# Patient Record
Sex: Female | Born: 1945 | Race: White | Hispanic: No | Smoking: Never smoker
Health system: Southern US, Community
[De-identification: ages and names within clinical notes are randomized; demographics above are authoritative.]

## PROBLEM LIST (undated history)

## (undated) DIAGNOSIS — K219 Gastro-esophageal reflux disease without esophagitis: Secondary | ICD-10-CM

## (undated) HISTORY — PX: ABDOMINAL HYSTERECTOMY: SHX81

---

## 2015-02-03 DIAGNOSIS — H9113 Presbycusis, bilateral: Secondary | ICD-10-CM | POA: Diagnosis not present

## 2015-02-03 DIAGNOSIS — Z0001 Encounter for general adult medical examination with abnormal findings: Secondary | ICD-10-CM | POA: Diagnosis not present

## 2015-02-03 DIAGNOSIS — R350 Frequency of micturition: Secondary | ICD-10-CM | POA: Diagnosis not present

## 2015-02-03 DIAGNOSIS — I10 Essential (primary) hypertension: Secondary | ICD-10-CM | POA: Diagnosis not present

## 2015-02-04 ENCOUNTER — Ambulatory Visit: Payer: Self-pay | Admitting: Family Medicine

## 2015-02-04 DIAGNOSIS — Z1231 Encounter for screening mammogram for malignant neoplasm of breast: Secondary | ICD-10-CM | POA: Diagnosis not present

## 2015-06-23 DIAGNOSIS — H903 Sensorineural hearing loss, bilateral: Secondary | ICD-10-CM | POA: Diagnosis not present

## 2016-02-09 ENCOUNTER — Ambulatory Visit
Admission: EM | Admit: 2016-02-09 | Discharge: 2016-02-09 | Disposition: A | Discharge status: Home / Self Care | Payer: Medicare Other | Attending: Family Medicine | Admitting: Family Medicine

## 2016-02-09 ENCOUNTER — Encounter: Payer: Self-pay | Admitting: Emergency Medicine

## 2016-02-09 ENCOUNTER — Ambulatory Visit: Payer: Medicare Other

## 2016-02-09 DIAGNOSIS — M5442 Lumbago with sciatica, left side: Secondary | ICD-10-CM | POA: Diagnosis not present

## 2016-02-09 DIAGNOSIS — M545 Low back pain: Secondary | ICD-10-CM | POA: Diagnosis not present

## 2016-02-09 MED ORDER — CYCLOBENZAPRINE HCL 5 MG PO TABS: 5.0000 mg | ORAL_TABLET | Freq: Two times a day (BID) | ORAL | Status: DC | PRN

## 2016-02-09 MED ORDER — METHYLPREDNISOLONE 4 MG PO TBPK: ORAL_TABLET | ORAL | Status: DC

## 2016-02-09 NOTE — ED Provider Notes (Signed)
CSN: [id number]     Arrival date & time 02/09/16  0825 History   First MD Initiated Contact with Patient 02/09/16 [phone]     Chief Complaint  Patient presents with  . Back Pain  . Hip Pain   (Consider location/radiation/quality/duration/timing/severity/associated sxs/prior Treatment) HPI: Patient presents today with symptoms of left-sided low back pain that radiates into the left leg. Patient denies any trauma or injury. Patient states that she's had the symptoms for the last 6 months. Over the last week she has noticed an increase in her symptoms. She denies any incontinence, foot drop, fever, weight loss. She denies ever having an MRI of her lower back. She attributes her blood pressure being elevated due to her pain. She denies any chest pain, shortness of breath, nausea, vomiting, severe headache.  History reviewed. No pertinent past medical history. Past Surgical History  Procedure Laterality Date  . Abdominal hysterectomy     History reviewed. No pertinent family history. Social History  Substance Use Topics  . Smoking status: Never Smoker   . Smokeless tobacco: None  . Alcohol Use: No   OB History    No data available     Review of Systems: Negative except mentioned above.   Allergies  Contrast media  Home Medications   Prior to Admission medications   Not on File   Meds Ordered and Administered this Visit  Medications - No data to display  BP 187/88 mmHg  Pulse 106  Temp(Src) 98.6 F (37 C) (Tympanic)  Resp 16  Ht 5\' 3"  (1.6 m)  Wt 179 lb (81.194 kg)  BMI 31.72 kg/m2  SpO2 100% No data found.   Physical Exam   GENERAL: NAD HEENT: no pharyngeal erythema, no exudate RESP: CTA B CARD: RRR MSK: no midline tenderness, mild left SI tenderness and mild lower lumbar paravertebral tenderness, ROM limited with flexion>extension, 4+/5 strength of LLE, +SLR, normal pedal pulses, -Homans, no foot drop appreciated  NEURO: CN II-XII grossly intact   ED Course   Procedures (including critical care time)  Labs Review Labs Reviewed - No data to display  Imaging Review No results found.   MDM   A/P: Left lower back pain with left radicular symptoms- lumbar spine x-rays do show degenerative changes, would recommend that patient follow up with her primary care physician this week for further evaluation and treatment, would recommend that she get an MRI to further evaluate the etiology of her symptoms. I did recommend to the patient that if she has any worsening symptoms or has incontinence, foot drop to go to the ER immediately. I will prescribe her Medrol dosepak and Flexeril. She can use Tylenol if needed as well. I've also asked that she follow up with her doctor regarding her blood pressure I do however feel that her blood pressure is elevated today due to her level of discomfort.   Jolene ProvostKirtida Mahkayla Preece, MD 02/09/16 1000

## 2016-02-09 NOTE — ED Notes (Signed)
Patient c/o lower back pain and left hip pain that goes into her left leg off and on for the past 6 month.

## 2017-02-06 DIAGNOSIS — H25813 Combined forms of age-related cataract, bilateral: Secondary | ICD-10-CM | POA: Diagnosis not present

## 2017-08-15 DIAGNOSIS — R945 Abnormal results of liver function studies: Secondary | ICD-10-CM | POA: Diagnosis not present

## 2017-08-15 DIAGNOSIS — R000 Tachycardia, unspecified: Secondary | ICD-10-CM | POA: Diagnosis not present

## 2017-08-15 DIAGNOSIS — R6884 Jaw pain: Secondary | ICD-10-CM | POA: Diagnosis not present

## 2017-08-15 DIAGNOSIS — Z8619 Personal history of other infectious and parasitic diseases: Secondary | ICD-10-CM | POA: Diagnosis not present

## 2017-08-15 DIAGNOSIS — R748 Abnormal levels of other serum enzymes: Secondary | ICD-10-CM | POA: Diagnosis not present

## 2017-08-15 DIAGNOSIS — R109 Unspecified abdominal pain: Secondary | ICD-10-CM | POA: Diagnosis not present

## 2017-08-15 DIAGNOSIS — K8000 Calculus of gallbladder with acute cholecystitis without obstruction: Secondary | ICD-10-CM | POA: Diagnosis not present

## 2017-08-15 DIAGNOSIS — R1011 Right upper quadrant pain: Secondary | ICD-10-CM | POA: Diagnosis not present

## 2017-08-15 DIAGNOSIS — I10 Essential (primary) hypertension: Secondary | ICD-10-CM | POA: Diagnosis not present

## 2017-08-15 DIAGNOSIS — K838 Other specified diseases of biliary tract: Secondary | ICD-10-CM | POA: Diagnosis not present

## 2017-08-15 DIAGNOSIS — K8021 Calculus of gallbladder without cholecystitis with obstruction: Secondary | ICD-10-CM | POA: Diagnosis not present

## 2017-08-15 DIAGNOSIS — R1031 Right lower quadrant pain: Secondary | ICD-10-CM | POA: Diagnosis not present

## 2017-08-15 DIAGNOSIS — K5790 Diverticulosis of intestine, part unspecified, without perforation or abscess without bleeding: Secondary | ICD-10-CM | POA: Diagnosis present

## 2017-08-15 DIAGNOSIS — R932 Abnormal findings on diagnostic imaging of liver and biliary tract: Secondary | ICD-10-CM | POA: Diagnosis not present

## 2017-08-15 DIAGNOSIS — K8020 Calculus of gallbladder without cholecystitis without obstruction: Secondary | ICD-10-CM | POA: Diagnosis not present

## 2017-08-15 DIAGNOSIS — R112 Nausea with vomiting, unspecified: Secondary | ICD-10-CM | POA: Diagnosis not present

## 2017-08-15 DIAGNOSIS — N390 Urinary tract infection, site not specified: Secondary | ICD-10-CM | POA: Diagnosis not present

## 2017-08-15 DIAGNOSIS — K8080 Other cholelithiasis without obstruction: Secondary | ICD-10-CM | POA: Diagnosis not present

## 2017-08-15 DIAGNOSIS — K8050 Calculus of bile duct without cholangitis or cholecystitis without obstruction: Secondary | ICD-10-CM | POA: Diagnosis not present

## 2017-08-15 DIAGNOSIS — K859 Acute pancreatitis without necrosis or infection, unspecified: Secondary | ICD-10-CM | POA: Diagnosis not present

## 2017-08-23 DIAGNOSIS — Z6832 Body mass index (BMI) 32.0-32.9, adult: Secondary | ICD-10-CM | POA: Diagnosis not present

## 2017-08-23 DIAGNOSIS — I10 Essential (primary) hypertension: Secondary | ICD-10-CM | POA: Diagnosis not present

## 2017-08-23 DIAGNOSIS — R945 Abnormal results of liver function studies: Secondary | ICD-10-CM | POA: Diagnosis not present

## 2017-08-23 DIAGNOSIS — Z131 Encounter for screening for diabetes mellitus: Secondary | ICD-10-CM | POA: Diagnosis not present

## 2017-08-23 DIAGNOSIS — K8020 Calculus of gallbladder without cholecystitis without obstruction: Secondary | ICD-10-CM | POA: Diagnosis not present

## 2017-08-23 DIAGNOSIS — Z79899 Other long term (current) drug therapy: Secondary | ICD-10-CM | POA: Diagnosis not present

## 2017-09-10 DIAGNOSIS — K8020 Calculus of gallbladder without cholecystitis without obstruction: Secondary | ICD-10-CM | POA: Diagnosis not present

## 2017-09-14 DIAGNOSIS — K829 Disease of gallbladder, unspecified: Secondary | ICD-10-CM | POA: Diagnosis not present

## 2017-09-14 DIAGNOSIS — R112 Nausea with vomiting, unspecified: Secondary | ICD-10-CM | POA: Diagnosis not present

## 2017-09-14 DIAGNOSIS — K8010 Calculus of gallbladder with chronic cholecystitis without obstruction: Secondary | ICD-10-CM | POA: Diagnosis not present

## 2017-09-14 DIAGNOSIS — I10 Essential (primary) hypertension: Secondary | ICD-10-CM | POA: Diagnosis not present

## 2017-09-14 DIAGNOSIS — Z9889 Other specified postprocedural states: Secondary | ICD-10-CM | POA: Diagnosis not present

## 2017-09-14 DIAGNOSIS — Z880 Allergy status to penicillin: Secondary | ICD-10-CM | POA: Diagnosis not present

## 2017-09-14 DIAGNOSIS — R7989 Other specified abnormal findings of blood chemistry: Secondary | ICD-10-CM | POA: Diagnosis not present

## 2017-09-14 DIAGNOSIS — Z87891 Personal history of nicotine dependence: Secondary | ICD-10-CM | POA: Diagnosis not present

## 2017-09-14 DIAGNOSIS — E669 Obesity, unspecified: Secondary | ICD-10-CM | POA: Diagnosis not present

## 2017-09-14 DIAGNOSIS — Z6832 Body mass index (BMI) 32.0-32.9, adult: Secondary | ICD-10-CM | POA: Diagnosis not present

## 2017-09-14 DIAGNOSIS — Z91012 Allergy to eggs: Secondary | ICD-10-CM | POA: Diagnosis not present

## 2017-09-14 DIAGNOSIS — K8020 Calculus of gallbladder without cholecystitis without obstruction: Secondary | ICD-10-CM | POA: Diagnosis not present

## 2017-09-15 DIAGNOSIS — E669 Obesity, unspecified: Secondary | ICD-10-CM | POA: Diagnosis not present

## 2017-09-15 DIAGNOSIS — Z880 Allergy status to penicillin: Secondary | ICD-10-CM | POA: Diagnosis not present

## 2017-09-15 DIAGNOSIS — R7989 Other specified abnormal findings of blood chemistry: Secondary | ICD-10-CM | POA: Diagnosis not present

## 2017-09-15 DIAGNOSIS — Z87891 Personal history of nicotine dependence: Secondary | ICD-10-CM | POA: Diagnosis not present

## 2017-09-15 DIAGNOSIS — K8010 Calculus of gallbladder with chronic cholecystitis without obstruction: Secondary | ICD-10-CM | POA: Diagnosis not present

## 2017-09-15 DIAGNOSIS — I10 Essential (primary) hypertension: Secondary | ICD-10-CM | POA: Diagnosis not present

## 2017-09-16 DIAGNOSIS — I10 Essential (primary) hypertension: Secondary | ICD-10-CM | POA: Diagnosis not present

## 2017-09-16 DIAGNOSIS — Z880 Allergy status to penicillin: Secondary | ICD-10-CM | POA: Diagnosis not present

## 2017-09-16 DIAGNOSIS — K8010 Calculus of gallbladder with chronic cholecystitis without obstruction: Secondary | ICD-10-CM | POA: Diagnosis not present

## 2017-09-16 DIAGNOSIS — Z87891 Personal history of nicotine dependence: Secondary | ICD-10-CM | POA: Diagnosis not present

## 2017-09-16 DIAGNOSIS — E669 Obesity, unspecified: Secondary | ICD-10-CM | POA: Diagnosis not present

## 2017-09-16 DIAGNOSIS — R7989 Other specified abnormal findings of blood chemistry: Secondary | ICD-10-CM | POA: Diagnosis not present

## 2017-10-08 DIAGNOSIS — R000 Tachycardia, unspecified: Secondary | ICD-10-CM | POA: Diagnosis not present

## 2017-10-08 DIAGNOSIS — Z8719 Personal history of other diseases of the digestive system: Secondary | ICD-10-CM | POA: Diagnosis not present

## 2017-10-08 DIAGNOSIS — R109 Unspecified abdominal pain: Secondary | ICD-10-CM | POA: Diagnosis not present

## 2017-10-08 DIAGNOSIS — R630 Anorexia: Secondary | ICD-10-CM | POA: Diagnosis not present

## 2017-10-08 DIAGNOSIS — Z9049 Acquired absence of other specified parts of digestive tract: Secondary | ICD-10-CM | POA: Diagnosis not present

## 2017-12-12 DIAGNOSIS — M546 Pain in thoracic spine: Secondary | ICD-10-CM | POA: Diagnosis not present

## 2017-12-12 DIAGNOSIS — I10 Essential (primary) hypertension: Secondary | ICD-10-CM | POA: Diagnosis not present

## 2017-12-12 DIAGNOSIS — M1711 Unilateral primary osteoarthritis, right knee: Secondary | ICD-10-CM | POA: Diagnosis not present

## 2017-12-12 DIAGNOSIS — Z6830 Body mass index (BMI) 30.0-30.9, adult: Secondary | ICD-10-CM | POA: Diagnosis not present

## 2018-07-25 ENCOUNTER — Other Ambulatory Visit: Payer: Self-pay | Admitting: Family Medicine

## 2018-07-25 DIAGNOSIS — M545 Low back pain: Secondary | ICD-10-CM | POA: Diagnosis not present

## 2018-07-25 DIAGNOSIS — H9113 Presbycusis, bilateral: Secondary | ICD-10-CM | POA: Diagnosis not present

## 2018-07-25 DIAGNOSIS — Z0001 Encounter for general adult medical examination with abnormal findings: Secondary | ICD-10-CM | POA: Diagnosis not present

## 2018-07-25 DIAGNOSIS — Z1231 Encounter for screening mammogram for malignant neoplasm of breast: Secondary | ICD-10-CM

## 2018-07-25 DIAGNOSIS — I10 Essential (primary) hypertension: Secondary | ICD-10-CM | POA: Diagnosis not present

## 2018-07-25 DIAGNOSIS — Z6831 Body mass index (BMI) 31.0-31.9, adult: Secondary | ICD-10-CM | POA: Diagnosis not present

## 2018-07-25 DIAGNOSIS — R1310 Dysphagia, unspecified: Secondary | ICD-10-CM | POA: Diagnosis not present

## 2018-07-25 DIAGNOSIS — R945 Abnormal results of liver function studies: Secondary | ICD-10-CM | POA: Diagnosis not present

## 2018-07-25 DIAGNOSIS — Z131 Encounter for screening for diabetes mellitus: Secondary | ICD-10-CM | POA: Diagnosis not present

## 2018-07-25 DIAGNOSIS — Z79899 Other long term (current) drug therapy: Secondary | ICD-10-CM | POA: Diagnosis not present

## 2018-07-31 ENCOUNTER — Encounter (INDEPENDENT_AMBULATORY_CARE_PROVIDER_SITE_OTHER): Payer: Self-pay

## 2018-07-31 ENCOUNTER — Ambulatory Visit
Admission: RE | Admit: 2018-07-31 | Discharge: 2018-07-31 | Disposition: A | Discharge status: Home / Self Care | Payer: Medicare Other | Source: Ambulatory Visit | Attending: Family Medicine | Admitting: Family Medicine

## 2018-07-31 DIAGNOSIS — Z1231 Encounter for screening mammogram for malignant neoplasm of breast: Secondary | ICD-10-CM | POA: Insufficient documentation

## 2018-08-27 DIAGNOSIS — R1310 Dysphagia, unspecified: Secondary | ICD-10-CM | POA: Diagnosis not present

## 2018-08-27 DIAGNOSIS — K219 Gastro-esophageal reflux disease without esophagitis: Secondary | ICD-10-CM | POA: Diagnosis not present

## 2018-10-16 DIAGNOSIS — R1310 Dysphagia, unspecified: Secondary | ICD-10-CM | POA: Diagnosis not present

## 2018-10-16 DIAGNOSIS — K219 Gastro-esophageal reflux disease without esophagitis: Secondary | ICD-10-CM | POA: Diagnosis not present

## 2019-06-17 ENCOUNTER — Other Ambulatory Visit: Payer: Self-pay | Admitting: Family Medicine

## 2019-06-17 ENCOUNTER — Other Ambulatory Visit: Payer: Self-pay | Admitting: Nurse Practitioner

## 2019-06-17 DIAGNOSIS — Z1231 Encounter for screening mammogram for malignant neoplasm of breast: Secondary | ICD-10-CM

## 2020-07-05 ENCOUNTER — Other Ambulatory Visit: Payer: Self-pay

## 2020-07-05 ENCOUNTER — Observation Stay
Admission: EM | Admit: 2020-07-05 | Discharge: 2020-07-06 | Disposition: A | Discharge status: Home / Self Care | Payer: Medicare Other | Attending: Internal Medicine | Admitting: Internal Medicine

## 2020-07-05 ENCOUNTER — Emergency Department: Payer: Medicare Other

## 2020-07-05 DIAGNOSIS — Z20822 Contact with and (suspected) exposure to covid-19: Secondary | ICD-10-CM | POA: Diagnosis not present

## 2020-07-05 DIAGNOSIS — Z853 Personal history of malignant neoplasm of breast: Secondary | ICD-10-CM | POA: Insufficient documentation

## 2020-07-05 DIAGNOSIS — K219 Gastro-esophageal reflux disease without esophagitis: Secondary | ICD-10-CM | POA: Diagnosis present

## 2020-07-05 DIAGNOSIS — I10 Essential (primary) hypertension: Secondary | ICD-10-CM | POA: Diagnosis not present

## 2020-07-05 DIAGNOSIS — N3000 Acute cystitis without hematuria: Secondary | ICD-10-CM

## 2020-07-05 DIAGNOSIS — I160 Hypertensive urgency: Secondary | ICD-10-CM | POA: Diagnosis not present

## 2020-07-05 DIAGNOSIS — R350 Frequency of micturition: Secondary | ICD-10-CM | POA: Diagnosis not present

## 2020-07-05 DIAGNOSIS — R5383 Other fatigue: Secondary | ICD-10-CM | POA: Insufficient documentation

## 2020-07-05 DIAGNOSIS — N390 Urinary tract infection, site not specified: Secondary | ICD-10-CM | POA: Diagnosis present

## 2020-07-05 DIAGNOSIS — R42 Dizziness and giddiness: Secondary | ICD-10-CM

## 2020-07-05 HISTORY — DX: Gastro-esophageal reflux disease without esophagitis: K21.9

## 2020-07-05 LAB — COMPREHENSIVE METABOLIC PANEL
ALT: 22 U/L (ref 0–44)
AST: 19 U/L (ref 15–41)
Albumin: 4.3 g/dL (ref 3.5–5.0)
Alkaline Phosphatase: 83 U/L (ref 38–126)
Anion gap: 9 (ref 5–15)
BUN: 24 mg/dL — ABNORMAL HIGH (ref 8–23)
CO2: 26 mmol/L (ref 22–32)
Calcium: 8.8 mg/dL — ABNORMAL LOW (ref 8.9–10.3)
Chloride: 104 mmol/L (ref 98–111)
Creatinine, Ser: 0.64 mg/dL (ref 0.44–1.00)
GFR calc Af Amer: >60 mL/min (ref >60)
GFR calc non Af Amer: >60 mL/min (ref >60)
Glucose, Bld: 147 mg/dL — ABNORMAL HIGH (ref 70–99)
Potassium: 3.5 mmol/L (ref 3.5–5.1)
Sodium: 139 mmol/L (ref 135–145)
Total Bilirubin: 0.8 mg/dL (ref 0.3–1.2)
Total Protein: 7.7 g/dL (ref 6.5–8.1)

## 2020-07-05 LAB — CBC WITH DIFFERENTIAL/PLATELET
Abs Immature Granulocytes: 0.03 10*3/uL (ref 0.00–0.07)
Basophils Absolute: 0.1 10*3/uL (ref 0.0–0.1)
Basophils Relative: 1 %
Eosinophils Absolute: 0.1 10*3/uL (ref 0.0–0.5)
Eosinophils Relative: 1 %
HCT: 45.9 % (ref 36.0–46.0)
Hemoglobin: 16.2 g/dL — ABNORMAL HIGH (ref 12.0–15.0)
Immature Granulocytes: 0 %
Lymphocytes Relative: 24 %
Lymphs Abs: 2 10*3/uL (ref 0.7–4.0)
MCH: 31 pg (ref 26.0–34.0)
MCHC: 35.3 g/dL (ref 30.0–36.0)
MCV: 87.8 fL (ref 80.0–100.0)
Monocytes Absolute: 0.6 10*3/uL (ref 0.1–1.0)
Monocytes Relative: 7 %
Neutro Abs: 5.7 10*3/uL (ref 1.7–7.7)
Neutrophils Relative %: 67 %
Platelets: 239 10*3/uL (ref 150–400)
RBC: 5.23 MIL/uL — ABNORMAL HIGH (ref 3.87–5.11)
RDW: 12.8 % (ref 11.5–15.5)
WBC: 8.5 10*3/uL (ref 4.0–10.5)
nRBC: 0 % (ref 0.0–0.2)

## 2020-07-05 LAB — URINALYSIS, COMPLETE (UACMP) WITH MICROSCOPIC
Bilirubin Urine: NEGATIVE
Glucose, UA: NEGATIVE mg/dL
Ketones, ur: 5 mg/dL — AB
Nitrite: NEGATIVE
Protein, ur: NEGATIVE mg/dL
Specific Gravity, Urine: 1.019 (ref 1.005–1.030)
WBC, UA: >50 WBC/hpf — ABNORMAL HIGH (ref 0–5)
pH: 5 (ref 5.0–8.0)

## 2020-07-05 LAB — MAGNESIUM: Magnesium: 2.2 mg/dL (ref 1.7–2.4)

## 2020-07-05 LAB — SARS CORONAVIRUS 2 BY RT PCR (HOSPITAL ORDER, PERFORMED IN ~~LOC~~ HOSPITAL LAB): SARS Coronavirus 2: NEGATIVE

## 2020-07-05 LAB — TROPONIN I (HIGH SENSITIVITY)
Troponin I (High Sensitivity): 5 ng/L (ref <18)
Troponin I (High Sensitivity): 5 ng/L (ref <18)

## 2020-07-05 MED ORDER — SODIUM CHLORIDE 0.9 % IV SOLN: 1.0000 g | Freq: Once | INTRAVENOUS | Status: DC

## 2020-07-05 MED ORDER — ONDANSETRON HCL 4 MG/2ML IJ SOLN
4.0000 mg | Freq: Once | INTRAMUSCULAR | Status: AC
  Administered 2020-07-05: 4 mg via INTRAVENOUS
  Filled 2020-07-05: qty 2

## 2020-07-05 MED ORDER — CLONIDINE HCL 0.1 MG PO TABS
0.1000 mg | ORAL_TABLET | Freq: Once | ORAL | Status: AC
  Administered 2020-07-05: 0.1 mg via ORAL
  Filled 2020-07-05: qty 1

## 2020-07-05 MED ORDER — AMLODIPINE BESYLATE 5 MG PO TABS
5.0000 mg | ORAL_TABLET | Freq: Once | ORAL | Status: AC
  Administered 2020-07-05: 5 mg via ORAL
  Filled 2020-07-05: qty 1

## 2020-07-05 MED ORDER — HYDRALAZINE HCL 20 MG/ML IJ SOLN
10.0000 mg | Freq: Once | INTRAMUSCULAR | Status: AC
  Administered 2020-07-05: 10 mg via INTRAVENOUS
  Filled 2020-07-05: qty 1

## 2020-07-05 MED ORDER — SODIUM CHLORIDE 0.9 % IV SOLN
1.0000 g | Freq: Once | INTRAVENOUS | Status: AC
  Administered 2020-07-05: 1 g via INTRAVENOUS
  Filled 2020-07-05: qty 10

## 2020-07-05 MED ORDER — SODIUM CHLORIDE 0.9 % IV BOLUS
1000.0000 mL | Freq: Once | INTRAVENOUS | Status: AC
  Administered 2020-07-05: 1000 mL via INTRAVENOUS

## 2020-07-05 MED ORDER — PANTOPRAZOLE SODIUM 40 MG PO TBEC
40.0000 mg | DELAYED_RELEASE_TABLET | Freq: Every day | ORAL | Status: DC
  Administered 2020-07-05 – 2020-07-06 (×2): 40 mg via ORAL
  Filled 2020-07-05 (×2): qty 1

## 2020-07-05 MED ORDER — MECLIZINE HCL 25 MG PO TABS
25.0000 mg | ORAL_TABLET | Freq: Three times a day (TID) | ORAL | Status: DC | PRN
  Administered 2020-07-06: 25 mg via ORAL
  Filled 2020-07-05 (×2): qty 1

## 2020-07-05 MED ORDER — LABETALOL HCL 5 MG/ML IV SOLN
10.0000 mg | Freq: Once | INTRAVENOUS | Status: AC
  Administered 2020-07-05: 10 mg via INTRAVENOUS
  Filled 2020-07-05: qty 4

## 2020-07-05 MED ORDER — MIDAZOLAM HCL 2 MG/2ML IJ SOLN
1.0000 mg | Freq: Once | INTRAMUSCULAR | Status: AC
  Administered 2020-07-05: 1 mg via INTRAVENOUS
  Filled 2020-07-05: qty 2

## 2020-07-05 MED ORDER — TRAMADOL HCL 50 MG PO TABS
50.0000 mg | ORAL_TABLET | Freq: Once | ORAL | Status: AC
  Administered 2020-07-06: 50 mg via ORAL
  Filled 2020-07-05 (×2): qty 1

## 2020-07-05 MED ORDER — HYDRALAZINE HCL 20 MG/ML IJ SOLN
5.0000 mg | INTRAMUSCULAR | Status: DC | PRN
  Administered 2020-07-06: 12:00:00 5 mg via INTRAVENOUS
  Filled 2020-07-05 (×2): qty 1

## 2020-07-05 MED ORDER — ACETAMINOPHEN 500 MG PO TABS
1000.0000 mg | ORAL_TABLET | Freq: Once | ORAL | Status: AC
  Administered 2020-07-05: 1000 mg via ORAL
  Filled 2020-07-05: qty 2

## 2020-07-05 MED ORDER — AMLODIPINE BESYLATE 10 MG PO TABS
10.0000 mg | ORAL_TABLET | Freq: Every day | ORAL | Status: DC
  Administered 2020-07-06: 10 mg via ORAL
  Filled 2020-07-05: qty 1

## 2020-07-05 MED ORDER — SODIUM CHLORIDE 0.9 % IV SOLN
2.0000 g | Freq: Once | INTRAVENOUS | Status: AC
  Administered 2020-07-06: 2 g via INTRAVENOUS
  Filled 2020-07-05: qty 2

## 2020-07-05 MED ORDER — ACETAMINOPHEN 325 MG PO TABS
650.0000 mg | ORAL_TABLET | Freq: Four times a day (QID) | ORAL | Status: DC | PRN
  Administered 2020-07-05 – 2020-07-06 (×3): 650 mg via ORAL
  Filled 2020-07-05 (×4): qty 2

## 2020-07-05 MED ORDER — ENOXAPARIN SODIUM 40 MG/0.4ML ~~LOC~~ SOLN
40.0000 mg | SUBCUTANEOUS | Status: DC
  Filled 2020-07-05 (×2): qty 0.4

## 2020-07-05 MED ORDER — ONDANSETRON HCL 4 MG/2ML IJ SOLN: 4.0000 mg | Freq: Once | INTRAMUSCULAR | Status: AC

## 2020-07-05 MED ORDER — ONDANSETRON HCL 4 MG/2ML IJ SOLN
INTRAMUSCULAR | Status: AC
  Administered 2020-07-05: 4 mg via INTRAVENOUS
  Filled 2020-07-05: qty 2

## 2020-07-05 MED ORDER — HYDROXYZINE HCL 10 MG PO TABS
10.0000 mg | ORAL_TABLET | Freq: Three times a day (TID) | ORAL | Status: DC | PRN
  Administered 2020-07-05 (×2): 10 mg via ORAL
  Filled 2020-07-05 (×4): qty 1

## 2020-07-05 NOTE — ED Triage Notes (Signed)
Pt arrives [facility] from home w cc of dizziness. Pt went to bathroom at 330, got dizzy and nauseous. Called thinking she had a stroke. Stroke screen negative with EMS. Pt reports weird feeling to R side of head for past few weeks, usually goes away.  BP 215/95

## 2020-07-05 NOTE — ED Notes (Signed)
Requested PRN nausea medication from pharmacy.

## 2020-07-05 NOTE — ED Notes (Signed)
Pt ambulated with this RN in room to toilet. Pt denies nausea at this time, did wobble a bit while walking with assistance. Reports 4/10 headache. Lights dimmed for comfort.

## 2020-07-05 NOTE — ED Provider Notes (Signed)
I assumed care of this patient from Dr. Megan Salon at approximately 0 700.  Please see her note for full details regarding patient's presentation and exam as well as initial work-up and plan of care.  In brief patient presented hypertensive with complaints of acute onset of vertigo and nausea.  Patient is a nonfocal neuro exam.  Initial troponin and ECG are reassuring although she does not have a slightly prolonged QTc interval.  CT head is unremarkable.  UA is concerning for possible cystitis and patient was given Rocephin via phone provider.  Plan is to follow-up MRI brain as well as repeat troponin to assess for evidence of CVA and ACS.  MRI unremarkable.  Repeat Trope unremarkable.  On my reassessment patient is still symptomatic and hypertensive.  Additional antihypertensive medications given although on subsequent reassessments patient states he still felt lightheaded while standing supine in bed.  Plan to admit for hypertensive urgency and dizziness in the setting of UTI.  Medications  acetaminophen (TYLENOL) tablet 650 mg (has no administration in time range)  hydrALAZINE (APRESOLINE) injection 5 mg (has no administration in time range)  meclizine (ANTIVERT) tablet 25 mg (has no administration in time range)  hydrOXYzine (ATARAX/VISTARIL) tablet 10 mg (10 mg Oral Given 07/05/20 1046)  enoxaparin (LOVENOX) injection 40 mg (has no administration in time range)  pantoprazole (PROTONIX) EC tablet 40 mg (has no administration in time range)  cefTRIAXone (ROCEPHIN) 1 g in sodium chloride 0.9 % 100 mL IVPB (has no administration in time range)  sodium chloride 0.9 % bolus 1,000 mL (0 mLs Intravenous Stopped 07/05/20 0946)  midazolam (VERSED) injection 1 mg (1 mg Intravenous Given 07/05/20 0512)  ondansetron (ZOFRAN) injection 4 mg (4 mg Intravenous Given 07/05/20 0511)  midazolam (VERSED) injection 1 mg (1 mg Intravenous Given 07/05/20 0702)  ondansetron (ZOFRAN) injection 4 mg (4 mg Intravenous Given 07/05/20 0655)   cefTRIAXone (ROCEPHIN) 1 g in sodium chloride 0.9 % 100 mL IVPB (0 g Intravenous Stopped 07/05/20 0752)  acetaminophen (TYLENOL) tablet 1,000 mg (1,000 mg Oral Given 07/05/20 0817)  labetalol (NORMODYNE) injection 10 mg (10 mg Intravenous Given 07/05/20 0817)  amLODipine (NORVASC) tablet 5 mg (5 mg Oral Given 07/05/20 0818)  hydrALAZINE (APRESOLINE) injection 10 mg (10 mg Intravenous Given 07/05/20 0843)  cloNIDine (CATAPRES) tablet 0.1 mg (0.1 mg Oral Given 07/05/20 0942)      Gilles Chiquito, MD 07/05/20 1046

## 2020-07-05 NOTE — Evaluation (Signed)
Occupational Therapy Evaluation Patient Details Name: Christy Mcclain MRN: [id number] DOB: 26-Jan-1946 Today's Date: 07/05/2020    History of Present Illness 74 y.o. female with medical history significant of breast cancer, hypertension, who presents with dizziness. Pt found with UTI.   Clinical Impression   Pt was seen for OT evaluation this date. Prior to hospital admission, pt was independent with mobility (although endorses reaching out for walls/furniture 2/2 arthritis pain in bilat knees, L hip), indep with ADL and IADL tasks including local distance driving, meal prep, cleaning, and med mgt. Pt lives by herself in a level entry apartment in a senior citizen community. She has call bells in her bathroom and beside her bed as well as grab bars in her bathroom. Pt endorses continued dizziness that does not worsen or improve with positional changes. No nystagmus noted. Pt also endorses headache. Currently pt demonstrates impairments as described below (See OT problem list) which functionally limit her ability to perform ADL/self-care tasks. Pt currently requires CGA for mobility not using AD and increased time/effort to perform LB dressing (LLE worse than RLE). Pt instructed in falls prevention strategies, AE/DME for ADL tasks to minimize arthritis pain, and home/routines modifications to maximize safety/indep with ADL/IADL Tasks. Pt verbalized understanding. Pt would benefit from skilled OT to address noted impairments and functional limitations (see below for any additional details) in order to maximize safety and independence while minimizing falls risk and caregiver burden. Upon hospital discharge, recommend OP OT to maximize pt safety and return to PLOF.     Follow Up Recommendations  Outpatient OT    Equipment Recommendations  Tub/shower seat;Toilet riser;Other (comment) (reacher, LH shoe horn, sock aide)    Recommendations for Other Services       Precautions / Restrictions  Precautions Precautions: Fall Restrictions Weight Bearing Restrictions: No      Mobility Bed Mobility Overal bed mobility: Modified Independent             General bed mobility comments: takes her time  Transfers Overall transfer level: Needs assistance Equipment used: None Transfers: Sit to/from Stand Sit to Stand: Min guard         General transfer comment: stiff/sore, initially pain limited, but able to perform without assist or significant LOB (would likely do better with AD)    Balance Overall balance assessment: Needs assistance Sitting-balance support: No upper extremity supported;Feet supported Sitting balance-Leahy Scale: Good     Standing balance support: No upper extremity supported Standing balance-Leahy Scale: Fair                             ADL either performed or assessed with clinical judgement   ADL Overall ADL's : Needs assistance/impaired                                       General ADL Comments: Pt requires CGA for ADL mobility and increased time/effort/pain with LB dressing tasks     Vision Baseline Vision/History: Wears glasses Wears Glasses: At all times Patient Visual Report: No change from baseline Vision Assessment?: No apparent visual deficits Additional Comments: no nystagmus noted     Perception     Praxis      Pertinent Vitals/Pain Pain Assessment: 0-10 Pain Score: 7  Pain Location: headache Pain Descriptors / Indicators: Headache Pain Intervention(s): Limited activity within patient's tolerance;Monitored during session;Repositioned  Hand Dominance Right   Extremity/Trunk Assessment Upper Extremity Assessment Upper Extremity Assessment: Overall WFL for tasks assessed   Lower Extremity Assessment Lower Extremity Assessment: Generalized weakness (pain limited)   Cervical / Trunk Assessment Cervical / Trunk Assessment: Normal   Communication Communication Communication: HOH    Cognition Arousal/Alertness: Awake/alert Behavior During Therapy: WFL for tasks assessed/performed Overall Cognitive Status: Within Functional Limits for tasks assessed                                     General Comments       Exercises Other Exercises Other Exercises: Pt instructed in falls prevention strategies, AE/DME for ADL tasks to minimize arthritis pain, and home/routines modifications to maximize safety/indep with ADL/IADL Tasks. Pt verbalized understanding.   Shoulder Instructions      Home Living Family/patient expects to be discharged to:: Private residence (older adult community) Living Arrangements: Alone Available Help at Discharge: Family;Friend(s);Available PRN/intermittently Type of Home: Apartment Home Access: Level entry     Home Layout: One level     Bathroom Shower/Tub: Chief Strategy Officer: Standard     Home Equipment: Grab bars - tub/shower;Grab bars - toilet          Prior Functioning/Environment Level of Independence: Independent        Comments: Pt indep with ambulation however reaches out for walls/furniture 2/2 BLE arthritis pain (bilat knees, L hip). Swims for exercise 3x/wk. Drives. Indep with ADL/IADL. Limited more in past year or so 2/2 arthritis pain.        OT Problem List: Decreased strength;Pain;Impaired balance (sitting and/or standing);Decreased activity tolerance;Decreased knowledge of use of DME or AE      OT Treatment/Interventions: Self-care/ADL training;Therapeutic exercise;Therapeutic activities;DME and/or AE instruction;Patient/family education;Energy conservation;Balance training    OT Goals(Current goals can be found in the care plan section) Acute Rehab OT Goals Patient Stated Goal: "I want to get stronger and feel like I can do the things I used to do." OT Goal Formulation: With patient Time For Goal Achievement: 07/19/20 Potential to Achieve Goals: Good ADL Goals Pt Will Perform  Lower Body Dressing: with modified independence;with adaptive equipment;sit to/from stand Pt Will Transfer to Toilet: with modified independence;ambulating (elevated commode, LRAD for amb) Additional ADL Goal #1: Pt will verbalize plan to implement at least 2 learned joint protection/energy conservation strategies to improve her pain and independence/safety with ADL/IADL Tasks.  OT Frequency: Min 1X/week   Barriers to D/C:            Co-evaluation              AM-PAC OT "6 Clicks" Daily Activity     Outcome Measure Help from another person eating meals?: None Help from another person taking care of personal grooming?: None Help from another person toileting, which includes using toliet, bedpan, or urinal?: A Little Help from another person bathing (including washing, rinsing, drying)?: A Little Help from another person to put on and taking off regular upper body clothing?: None Help from another person to put on and taking off regular lower body clothing?: A Little 6 Click Score: 21   End of Session Equipment Utilized During Treatment: Gait belt  Activity Tolerance: Patient tolerated treatment well Patient left: in bed;with call bell/phone within reach  OT Visit Diagnosis: Other abnormalities of gait and mobility (R26.89);Muscle weakness (generalized) (M62.81);Pain Pain - Right/Left: Left Pain - part of body: Hip;Knee (  and bilat knees)                Time: 8341-9622 OT Time Calculation (min): 33 min Charges:  OT General Charges $OT Visit: 1 Visit OT Evaluation $OT Eval Moderate Complexity: 1 Mod OT Treatments $Self Care/Home Management : 23-37 mins  Richrd Prime, MPH, MS, OTR/L ascom [phone] 07/05/20, 5:07 PM

## 2020-07-05 NOTE — ED Notes (Signed)
Per MD Dolores Frame give versed immediately before pt leaves for MRI and place cold washcloth over eyes.

## 2020-07-05 NOTE — ED Notes (Signed)
Pt resting peacefully at this time.

## 2020-07-05 NOTE — ED Notes (Signed)
EDP aware of pt high BP

## 2020-07-05 NOTE — H&P (Signed)
History and Physical    Christy Mcclain QMG:[id number] DOB: July 10, 1946 DOA: 07/05/2020  Referring MD/NP/PA:   PCP: Patient, No Pcp Per   Patient coming from:  The patient is coming from home.  At baseline, pt is independent for most of ADL.        Chief Complaint: dizziness  HPI: Christy Mcclain is a 74 y.o. female with medical history significant of breast cancer, hypertension, who presents with dizziness.  Patient states that her symptoms started at about 3:30 AM when he went to the bathroom.  He feels dizzy and lightheaded. Pt reports weird feeling to R side of head for past few weeks, which has resolved. She describes sensation of room spinning. She is unable to lay on either sides which exacerbates the dizziness.  Patient does not have unilateral numbness or tingling in extremities.  No facial droop or slurred speech.  Patient denies chest pain, shortness breath, cough, fever or chills.  She has nausea, but no vomiting, diarrhea or abdominal pain.  Patient has increased urinary frequency, but no dysuria or burning on urination.  Patient states that she has history of hypertension, but is not taking medications.  ED Course: pt was found to have WBC 8.5, troponin V, pending COVID-19 PCR, positive urinalysis (cloudy appearance, small amount of leukocyte, many bacteria, WBC> 50), electrolytes renal function okay, blood pressure 215/95, heart rate 79, RR 21, oxygen sat 94% on room air, temperature normal.  CT head negative for acute intracranial abnormalities.  MRI of the brain is motion degraded, but is negative for stroke.  Patient is placed on progressive bed of observation  Review of Systems:   General: no fevers, chills, no body weight gain, has fatigue HEENT: no blurry vision, hearing changes or sore throat Respiratory: no dyspnea, coughing, wheezing CV: no chest pain, no palpitations GI: has nausea, no vomiting, abdominal pain, diarrhea, constipation GU: no dysuria, burning on  urination, has increased urinary frequency, no hematuria  Ext: no leg edema Neuro: no unilateral weakness, numbness, or tingling, no vision change or hearing loss. Has dizziness. Skin: no rash, no skin tear. MSK: No muscle spasm, no deformity, no limitation of range of movement in spin Heme: No easy bruising.  Travel history: No recent long distant travel.  Allergy:  Allergies  Allergen Reactions  . Contrast Media [Iodinated Diagnostic Agents] Anaphylaxis    Past Medical History:  Diagnosis Date  . GERD (gastroesophageal reflux disease)     Past Surgical History:  Procedure Laterality Date  . ABDOMINAL HYSTERECTOMY      Social History:  reports that she has never smoked. She does not have any smokeless tobacco history on file. She reports that she does not drink alcohol and does not use drugs.  Family History:  Family History  Problem Relation Age of Onset  . Breast cancer Maternal Aunt   . Breast cancer Cousin        pat cousin     Prior to Admission medications   Medication Sig Start Date End Date Taking? Authorizing Provider  cyclobenzaprine (FLEXERIL) 5 MG tablet Take 1 tablet (5 mg total) by mouth 2 (two) times daily as needed for muscle spasms (can cause drowsiness.). 02/09/16   Jolene Provost, MD  methylPREDNISolone (MEDROL DOSEPAK) 4 MG TBPK tablet Use as directed for 6 days. 02/09/16   Jolene Provost, MD    Physical Exam: Vitals:   07/05/20 0756 07/05/20 0830 07/05/20 0838 07/05/20 0930  BP: (!) 214/87 (!) 200/90 (!) 196/90 Marland Kitchen)  183/83  Pulse: 89  79 92  Resp: 21 18 21 18   Temp:      TempSrc:      SpO2: 99%  94% 100%  Weight:      Height:       General: Not in acute distress HEENT:       Eyes: PERRL, EOMI, no scleral icterus.       ENT: No discharge from the ears and nose, no pharynx injection, no tonsillar enlargement.        Neck: No JVD, no bruit, no mass felt. Heme: No neck lymph node enlargement. Cardiac: S1/S2, RRR, No murmurs, No gallops or  rubs. Respiratory: No rales, wheezing, rhonchi or rubs. GI: Soft, nondistended, nontender, no rebound pain, no organomegaly, BS present. GU: No hematuria Ext: No pitting leg edema bilaterally. 2+DP/PT pulse bilaterally. Musculoskeletal: No joint deformities, No joint redness or warmth, no limitation of ROM in spin. Skin: No rashes.  Neuro: Alert, oriented X3, cranial nerves II-XII grossly intact, moves all extremities normally. Muscle strength 5/5 in all extremities, sensation to light touch intact. Brachial reflex 2+ bilaterally.  Psych: Patient is not psychotic, no suicidal or hemocidal ideation.  Labs on Admission: I have personally reviewed following labs and imaging studies  CBC: Recent Labs  Lab 07/05/20 0501  WBC 8.5  NEUTROABS 5.7  HGB 16.2*  HCT 45.9  MCV 87.8  PLT 239   Basic Metabolic Panel: Recent Labs  Lab 07/05/20 0501  NA 139  K 3.5  CL 104  CO2 26  GLUCOSE 147*  BUN 24*  CREATININE 0.64  CALCIUM 8.8*  MG 2.2   GFR: Estimated Creatinine Clearance: 62 mL/min (by C-G formula based on SCr of 0.64 mg/dL). Liver Function Tests: Recent Labs  Lab 07/05/20 0501  AST 19  ALT 22  ALKPHOS 83  BILITOT 0.8  PROT 7.7  ALBUMIN 4.3   No results for input(s): LIPASE, AMYLASE in the last 168 hours. No results for input(s): AMMONIA in the last 168 hours. Coagulation Profile: No results for input(s): INR, PROTIME in the last 168 hours. Cardiac Enzymes: No results for input(s): CKTOTAL, CKMB, CKMBINDEX, TROPONINI in the last 168 hours. BNP (last 3 results) No results for input(s): PROBNP in the last 8760 hours. HbA1C: No results for input(s): HGBA1C in the last 72 hours. CBG: No results for input(s): GLUCAP in the last 168 hours. Lipid Profile: No results for input(s): CHOL, HDL, LDLCALC, TRIG, CHOLHDL, LDLDIRECT in the last 72 hours. Thyroid Function Tests: No results for input(s): TSH, T4TOTAL, FREET4, T3FREE, THYROIDAB in the last 72 hours. Anemia  Panel: No results for input(s): VITAMINB12, FOLATE, FERRITIN, TIBC, IRON, RETICCTPCT in the last 72 hours. Urine analysis:    Component Value Date/Time   COLORURINE YELLOW (A) 07/05/2020 0502   APPEARANCEUR CLOUDY (A) 07/05/2020 0502   LABSPEC 1.019 07/05/2020 0502   PHURINE 5.0 07/05/2020 0502   GLUCOSEU NEGATIVE 07/05/2020 0502   HGBUR SMALL (A) 07/05/2020 0502   BILIRUBINUR NEGATIVE 07/05/2020 0502   KETONESUR 5 (A) 07/05/2020 0502   PROTEINUR NEGATIVE 07/05/2020 0502   NITRITE NEGATIVE 07/05/2020 0502   LEUKOCYTESUR SMALL (A) 07/05/2020 0502   Sepsis Labs: @LABRCNTIP (procalcitonin:4,lacticidven:4) )No results found for this or any previous visit (from the past 240 hour(s)).   Radiological Exams on Admission: CT Head Wo Contrast  Result Date: 07/05/2020 CLINICAL DATA:  Nonspecific dizziness. EXAM: CT HEAD WITHOUT CONTRAST TECHNIQUE: Contiguous axial images were obtained from the base of the skull through the vertex without  intravenous contrast. COMPARISON:  None. FINDINGS: Brain: No evidence of acute infarction, hemorrhage, hydrocephalus, extra-axial collection or mass lesion/mass effect. Vascular: No hyperdense vessel or unexpected calcification. Skull: Normal. Negative for fracture or focal lesion. Sinuses/Orbits: Negative IMPRESSION: Negative head CT. Electronically Signed   By: Marnee Spring M.D.   On: 07/05/2020 05:32   MR BRAIN WO CONTRAST  Result Date: 07/05/2020 CLINICAL DATA:  Chronic headache with no new features.  Dizziness. EXAM: MRI HEAD WITHOUT CONTRAST TECHNIQUE: Multiplanar, multiecho pulse sequences of the brain and surrounding structures were obtained without intravenous contrast. COMPARISON:  Head CT earlier today FINDINGS: Brain: No acute infarction, hemorrhage, hydrocephalus, extra-axial collection or mass lesion. Normal brain volume white matter appearance Vascular: Normal flow voids, including vertebral and basilar Skull and upper cervical spine: Normal marrow  signal Sinuses/Orbits: Negative Other: Motion degraded study, requiring fast brain protocol IMPRESSION: Negative motion degraded brain MRI. Electronically Signed   By: Marnee Spring M.D.   On: 07/05/2020 07:53     EKG: Independently reviewed.  Sinus rhythm, QTC 519, LAE, low voltage, nonspecific T wave change  Assessment/Plan Principal Problem:   Hypertensive urgency Active Problems:   UTI (urinary tract infection)   Dizziness   GERD (gastroesophageal reflux disease)   Hypertensive urgency: Blood pressure 215/95 in ED.  Patient received 5 mg of amlodipine, hydralazine 10 mg, labetalol 10 mg, clonidine 0.1 mg in ED. Blood pressure 196/90 -->160/79.  -Will place on progressive for observation - will continue amlodipine 10 mg daily -prn hydralazine  UTI (urinary tract infection) -rocephin -Urine culture  Dizziness: CT head negative.  MRI of the brain is most integrated study, but negative for stroke.  May be due to hypertensive urgency.  Another potential differential diagnosis is BPPV. -Pt/ot -prn meclizine  GERD: -protonix    DVT ppx: SQ Lovenox Code Status: Full code Family Communication: not done, no family member is at bed side.             Disposition Plan:  Anticipate discharge back to previous environment Consults called:  none Admission status:   progressive unit for obs      Status is: Observation  The patient remains OBS appropriate and will d/c before 2 midnights.  Dispo: The patient is from: Home              Anticipated d/c is to: Home              Anticipated d/c date is: 1 day              Patient currently is not medically stable to d/c.           Date of Service 07/05/2020    Lorretta Harp [facility]   If 7PM-7AM, please contact night-coverage [email or web address] 07/05/2020, 10:02 AM

## 2020-07-05 NOTE — ED Notes (Signed)
Pt transported to CT scan.

## 2020-07-05 NOTE — ED Provider Notes (Signed)
[facility] Emergency Department Provider Note   ____________________________________________   First MD Initiated Contact with Patient 07/05/20 0500     (approximate)  I have reviewed the triage vital signs and the nursing notes.   HISTORY  Chief Complaint Dizziness    HPI Christy Mcclain is a 74 y.o. female brought to the ED via EMS from home with a chief complaint of dizziness.  Patient went to the restroom at 3:30 AM, became dizzy and nauseous.  Describes sensation of room spinning and unable to lay flat which exacerbates the dizziness.  Denies slurred speech, facial droop, extremity weakness or falling.  Does report weird sensation to the right top side of her head for the past few weeks which goes away without intervention.  Denies fever, vision changes, neck pain, cough, chest pain, shortness of breath, abdominal pain, vomiting.  Denies recent travel, trauma or anticoagulant use.  Patient reports last few times her blood pressure has been elevated in the office but her doctor has not started her on blood pressure medicines yet.     Past medical history Osteoporosis GERD  There are no problems to display for this patient.   Past Surgical History:  Procedure Laterality Date  . ABDOMINAL HYSTERECTOMY      Prior to Admission medications   Medication Sig Start Date End Date Taking? Authorizing Provider  cyclobenzaprine (FLEXERIL) 5 MG tablet Take 1 tablet (5 mg total) by mouth 2 (two) times daily as needed for muscle spasms (can cause drowsiness.). 02/09/16   Jolene Provost, MD  methylPREDNISolone (MEDROL DOSEPAK) 4 MG TBPK tablet Use as directed for 6 days. 02/09/16   Jolene Provost, MD    Allergies Contrast media [iodinated diagnostic agents]  Family History  Problem Relation Age of Onset  . Breast cancer Maternal Aunt   . Breast cancer Cousin        pat cousin    Social History Social History   Tobacco Use  . Smoking status: Never  Smoker  Substance Use Topics  . Alcohol use: No  . Drug use: Never    Review of Systems  Constitutional: No fever/chills Eyes: No visual changes. ENT: No sore throat. Cardiovascular: Denies chest pain. Respiratory: Denies shortness of breath. Gastrointestinal: No abdominal pain.  No nausea, no vomiting.  No diarrhea.  No constipation. Genitourinary: Negative for dysuria. Musculoskeletal: Negative for back pain. Skin: Negative for rash. Neurological: Positive for headache and dizziness.  Negative for focal weakness or numbness.   ____________________________________________   PHYSICAL EXAM:  VITAL SIGNS: ED Triage Vitals  Enc Vitals Group     BP --      Pulse --      Resp --      Temp 07/05/20 0458 98.1 F (36.7 C)     Temp Source 07/05/20 0458 Oral     SpO2 --      Weight 07/05/20 0501 183 lb (83 kg)     Height 07/05/20 0501 5\' 3"  (1.6 m)     Head Circumference --      Peak Flow --      Pain Score 07/05/20 0501 0     Pain Loc --      Pain Edu? --      Excl. in GC? --     Constitutional: Alert and oriented. Well appearing and in mild acute distress. Eyes: Conjunctivae are normal. PERRL. EOMI. Head: Atraumatic. Nose: No congestion/rhinnorhea. Mouth/Throat: Mucous membranes are moist.   Neck: No stridor.  Supple neck without meningismus.  No carotid bruits. Cardiovascular: Normal rate, regular rhythm. Grossly normal heart sounds.  Good peripheral circulation. Respiratory: Normal respiratory effort.  No retractions. Lungs CTAB. Gastrointestinal: Soft and nontender. No distention. No abdominal bruits. No CVA tenderness. Musculoskeletal: No lower extremity tenderness nor edema.  No joint effusions. Neurologic: Alert and oriented x3.  CN II to XII roughly intact.  Normal speech and language. No gross focal neurologic deficits are appreciated. MAEx4. Skin:  Skin is warm, dry and intact. No rash noted.  No petechiae. Psychiatric: Mood and affect are normal. Speech and  behavior are normal.  ____________________________________________   LABS (all labs ordered are listed, but only abnormal results are displayed)  Labs Reviewed  CBC WITH DIFFERENTIAL/PLATELET - Abnormal; Notable for the following components:      Result Value   RBC 5.23 (*)    Hemoglobin 16.2 (*)    All other components within normal limits  COMPREHENSIVE METABOLIC PANEL - Abnormal; Notable for the following components:   Glucose, Bld 147 (*)    BUN 24 (*)    Calcium 8.8 (*)    All other components within normal limits  URINALYSIS, COMPLETE (UACMP) WITH MICROSCOPIC - Abnormal; Notable for the following components:   Color, Urine YELLOW (*)    APPearance CLOUDY (*)    Hgb urine dipstick SMALL (*)    Ketones, ur 5 (*)    Leukocytes,Ua SMALL (*)    WBC, UA >50 (*)    Bacteria, UA MANY (*)    All other components within normal limits  TROPONIN I (HIGH SENSITIVITY)  TROPONIN I (HIGH SENSITIVITY)   ____________________________________________  EKG  ED ECG REPORT I, Keshonna Valvo J, the attending physician, personally viewed and interpreted this ECG.   Date: 07/05/2020  EKG Time: 0505  Rate: 103  Rhythm: sinus tachycardia  Axis: Normal  Intervals:QtC 519  ST&T Change: Nonspecific No prior EKG for comparison ____________________________________________  RADIOLOGY  ED MD interpretation:  No ICH  Official radiology report(s): CT Head Wo Contrast  Result Date: 07/05/2020 CLINICAL DATA:  Nonspecific dizziness. EXAM: CT HEAD WITHOUT CONTRAST TECHNIQUE: Contiguous axial images were obtained from the base of the skull through the vertex without intravenous contrast. COMPARISON:  None. FINDINGS: Brain: No evidence of acute infarction, hemorrhage, hydrocephalus, extra-axial collection or mass lesion/mass effect. Vascular: No hyperdense vessel or unexpected calcification. Skull: Normal. Negative for fracture or focal lesion. Sinuses/Orbits: Negative IMPRESSION: Negative head CT.  Electronically Signed   By: Marnee Spring M.D.   On: 07/05/2020 05:32    ____________________________________________   PROCEDURES  Procedure(s) performed (including Critical Care):  .1-3 Lead EKG Interpretation Performed by: Irean Hong, MD Authorized by: Irean Hong, MD     Interpretation: normal     ECG rate:  98   ECG rate assessment: normal     Rhythm: sinus rhythm     Ectopy: none     Conduction: normal   Comments:     Patient placed on cardiac monitor to evaluate for arrhythmias     ____________________________________________   INITIAL IMPRESSION / ASSESSMENT AND PLAN / ED COURSE  As part of my medical decision making, I reviewed the following data within the electronic MEDICAL RECORD NUMBER Nursing notes reviewed and incorporated, Labs reviewed, EKG interpreted, Old chart reviewed, Radiograph reviewed  and Notes from prior ED visits     Christy Mcclain was evaluated in Emergency Department on 07/05/2020 for the symptoms described in the history of present illness. She was  evaluated in the context of the global COVID-19 pandemic, which necessitated consideration that the patient might be at risk for infection with the SARS-CoV-2 virus that causes COVID-19. Institutional protocols and algorithms that pertain to the evaluation of patients at risk for COVID-19 are in a state of rapid change based on information released by regulatory bodies including the CDC and federal and state organizations. These policies and algorithms were followed during the patient's care in the ED.    74 year old female with undiagnosed hypertension presenting with dizziness, vertigo and headache for several weeks. Differential diagnosis includes, but is not limited to, BPV, CVA, hypertensive urgency, intracranial hemorrhage, meningitis/encephalitis, previous head trauma, cavernous venous thrombosis, tension headache, temporal arteritis, migraine or migraine equivalent, idiopathic intracranial  hypertension, and non-specific headache.  Will obtain cardiac work-up to include CT scan.  Administer IV Versed and Zofran for dizziness and nausea.  I personally reviewed patient's record and see that she had an office visit on 06/23/2020 where her BP was 200/90.   Clinical Course as of Jul 05 699  Mon Jul 05, 2020  0606 Dizziness improved.  Blood pressure improved, currently 189/98.  Nurse assisted her to the restroom and noted she was wobbly.  Given patient's dizziness and elevated blood pressure, will obtain MRI of the brain.  Will administer another 1 mg IV Versed prior to MRI for calming.   [JS]  [id number] Care transferred to Dr. Katrinka Blazing at change of shift pending MRI, repeat troponin, reassess and disposition.  IV antibiotic ordered for UTI.   [JS]    Clinical Course User Index [JS] Irean Hong, MD     ____________________________________________   FINAL CLINICAL IMPRESSION(S) / ED DIAGNOSES  Final diagnoses:  Dizziness  Vertigo  Essential hypertension  Urinary tract infection without hematuria, site unspecified     ED Discharge Orders    None       Note:  This document was prepared using Dragon voice recognition software and may include unintentional dictation errors.   Irean Hong, MD 07/05/20 0700

## 2020-07-05 NOTE — ED Notes (Signed)
Pt to MRI

## 2020-07-05 NOTE — ED Notes (Signed)
EDP at bedside  

## 2020-07-06 DIAGNOSIS — I160 Hypertensive urgency: Secondary | ICD-10-CM

## 2020-07-06 LAB — BASIC METABOLIC PANEL
Anion gap: 9 (ref 5–15)
BUN: 15 mg/dL (ref 8–23)
CO2: 26 mmol/L (ref 22–32)
Calcium: 8.9 mg/dL (ref 8.9–10.3)
Chloride: 105 mmol/L (ref 98–111)
Creatinine, Ser: 0.71 mg/dL (ref 0.44–1.00)
GFR calc Af Amer: >60 mL/min (ref >60)
GFR calc non Af Amer: >60 mL/min (ref >60)
Glucose, Bld: 114 mg/dL — ABNORMAL HIGH (ref 70–99)
Potassium: 3.3 mmol/L — ABNORMAL LOW (ref 3.5–5.1)
Sodium: 140 mmol/L (ref 135–145)

## 2020-07-06 LAB — CBC
HCT: 43.3 % (ref 36.0–46.0)
Hemoglobin: 14.3 g/dL (ref 12.0–15.0)
MCH: 30 pg (ref 26.0–34.0)
MCHC: 33 g/dL (ref 30.0–36.0)
MCV: 91 fL (ref 80.0–100.0)
Platelets: 217 10*3/uL (ref 150–400)
RBC: 4.76 MIL/uL (ref 3.87–5.11)
RDW: 13 % (ref 11.5–15.5)
WBC: 7.4 10*3/uL (ref 4.0–10.5)
nRBC: 0 % (ref 0.0–0.2)

## 2020-07-06 MED ORDER — AMLODIPINE BESYLATE 10 MG PO TABS: 10.0000 mg | ORAL_TABLET | Freq: Every day | ORAL | 1 refills | Status: AC

## 2020-07-06 MED ORDER — MECLIZINE HCL 25 MG PO TABS: 25.0000 mg | ORAL_TABLET | Freq: Three times a day (TID) | ORAL | 0 refills | Status: AC | PRN

## 2020-07-06 MED ORDER — POTASSIUM CHLORIDE CRYS ER 20 MEQ PO TBCR
40.0000 meq | EXTENDED_RELEASE_TABLET | Freq: Once | ORAL | Status: AC
  Administered 2020-07-06: 09:00:00 40 meq via ORAL
  Filled 2020-07-06: qty 2

## 2020-07-06 MED ORDER — CEFDINIR 300 MG PO CAPS
300.0000 mg | ORAL_CAPSULE | Freq: Two times a day (BID) | ORAL | 0 refills | Status: AC
Start: 2020-07-06 — End: 2020-07-11

## 2020-07-06 NOTE — TOC Progression Note (Signed)
Transition of Care [facility]) - Progression Note    Patient Details  Name: Christy Mcclain MRN: [id number] Date of Birth: June 21, 1946  Transition of Care [facility] CM/SW Contact  Allayne Butcher, RN Phone Number: 07/06/2020, 12:11 PM  Clinical Narrative:    Referral for outpatient PT sent down to [facility] Outpatient rehab department.  Patient's daughter will be coming up to the hospital this afternoon and will provide transportation home if patient discharges today.    Expected Discharge Plan: Home/Self Care Barriers to Discharge: Continued Medical Work up  Expected Discharge Plan and Services Expected Discharge Plan: Home/Self Care       Living arrangements for the past 2 months: Apartment                                       Social Determinants of Health (SDOH) Interventions    Readmission Risk Interventions No flowsheet data found.

## 2020-07-06 NOTE — Discharge Summary (Signed)
Physician Discharge Summary  Christy Mcclain FMB:[id number] DOB: 12-21-1945 DOA: 07/05/2020  PCP: Olena Leatherwood, FNP  Admit date: 07/05/2020 Discharge date: 07/06/2020  Admitted From: Home Disposition: Home  Recommendations for Outpatient Follow-up:  1. Follow up with PCP in 1-2 weeks 2. Please obtain BMP/CBC in one week 3. Please follow up on the following pending results: Urine culture results.  Home Health: No Equipment/Devices: None Discharge Condition: Stable CODE STATUS: Full Diet recommendation: Heart Healthy   Brief/Interim Summary: Christy Mcclain is a 74 y.o. female with medical history significant of breast cancer, hypertension, who presents with dizziness. Cripe more like room spinning and appears positional as it get worse with quick eye movement and turning to the left.  Meclizine helps. She was discharged with meclizine and outpatient referral for vestibular rehab.  MRI brain was negative for any acute infarct.  Likely a positional vertigo.  On admission she found to have a blood pressure of 215/95.  Apparently patient was on antihypertensives many years ago and then stopped taking it.  She was tarted on amlodipine with good response.  She was advised to follow-up with her primary care provider for further management.  Was also complaining of increased urinary frequency, no leukocytosis, afebrile.  UA look infected.  Urine cultures growing E. coli, pending susceptibility results.  Patient had a prior urine culture with Enterobacter cloacea in September 2020, she was given Rocephin while in the hospital and discharged on cefdinir to complete a 5-day course.  We will continue rest of her home medications and follow-up with her primary care provider.  Discharge Diagnoses:  Principal Problem:   Hypertensive urgency Active Problems:   UTI (urinary tract infection)   Dizziness   GERD (gastroesophageal reflux disease)  Discharge Instructions  Discharge  Instructions    Diet - low sodium heart healthy   Complete by: As directed    Discharge instructions   Complete by: As directed    It was pleasure taking care of you. Please use meclizine 2-3 times daily for next couple of days and then as needed. It looks like you are having a problem with your inner ear, vestibular rehab therapy is most of the time very effective.  You are being provided with a referral for outpatient rehab including vestibular rehab. Your urine look infected and preliminary cultures results are growing E. Coli, I am giving you antibiotics based on sensitivity of your prior cultures and should be able to cover your current bacteria, please take it as directed for 5 days, if there is any different susceptibility reported on your susceptibility result we will give you a call. Your blood pressure was also high, we started you on amlodipine, please follow-up with your primary care provider within a week for further management.   Increase activity slowly   Complete by: As directed      Allergies as of 07/06/2020      Reactions   Contrast Media [iodinated Diagnostic Agents] Anaphylaxis      Medication List    TAKE these medications   amLODipine 10 MG tablet Commonly known as: NORVASC Take 1 tablet (10 mg total) by mouth daily. Start taking on: July 07, 2020   cefdinir 300 MG capsule Commonly known as: OMNICEF Take 1 capsule (300 mg total) by mouth 2 (two) times daily for 5 days.   ibuprofen 800 MG tablet Commonly known as: ADVIL   meclizine 25 MG tablet Commonly known as: ANTIVERT Take 1 tablet (25 mg total) by mouth  3 (three) times daily as needed for dizziness.   omeprazole 40 MG capsule Commonly known as: PRILOSEC Take 40 mg by mouth daily.       Follow-up Information    Olena LeatherwoodFarrug, Eugene D, FNP. Schedule an appointment as soon as possible for a visit.   Specialty: Nurse Practitioner Contact information: [address] [place] [id number] [phone]               Allergies  Allergen Reactions  . Contrast Media [Iodinated Diagnostic Agents] Anaphylaxis    Consultations:  None  Procedures/Studies: CT Head Wo Contrast  Result Date: 07/05/2020 CLINICAL DATA:  Nonspecific dizziness. EXAM: CT HEAD WITHOUT CONTRAST TECHNIQUE: Contiguous axial images were obtained from the base of the skull through the vertex without intravenous contrast. COMPARISON:  None. FINDINGS: Brain: No evidence of acute infarction, hemorrhage, hydrocephalus, extra-axial collection or mass lesion/mass effect. Vascular: No hyperdense vessel or unexpected calcification. Skull: Normal. Negative for fracture or focal lesion. Sinuses/Orbits: Negative IMPRESSION: Negative head CT. Electronically Signed   By: Marnee SpringJonathon  Watts M.D.   On: 07/05/2020 05:32   MR BRAIN WO CONTRAST  Result Date: 07/05/2020 CLINICAL DATA:  Chronic headache with no new features.  Dizziness. EXAM: MRI HEAD WITHOUT CONTRAST TECHNIQUE: Multiplanar, multiecho pulse sequences of the brain and surrounding structures were obtained without intravenous contrast. COMPARISON:  Head CT earlier today FINDINGS: Brain: No acute infarction, hemorrhage, hydrocephalus, extra-axial collection or mass lesion. Normal brain volume white matter appearance Vascular: Normal flow voids, including vertebral and basilar Skull and upper cervical spine: Normal marrow signal Sinuses/Orbits: Negative Other: Motion degraded study, requiring fast brain protocol IMPRESSION: Negative motion degraded brain MRI. Electronically Signed   By: Marnee SpringJonathon  Watts M.D.   On: 07/05/2020 07:53     Subjective: Patient continued to have mild dizziness, like room spinning when turning on her right side.  Dizziness improves with staying still.  No nausea or vomiting.  Discharge Exam: Vitals:   07/06/20 0807 07/06/20 1128  BP: (!) 159/80 (!) 185/85  Pulse: 68 80  Resp: 18 18  Temp: (!) 97.2 F (36.2 C) 97.9 F (36.6 C)  SpO2: 98% 97%   Vitals:    07/05/20 2353 07/06/20 0351 07/06/20 0807 07/06/20 1128  BP: 136/66 130/60 (!) 159/80 (!) 185/85  Pulse: 95 77 68 80  Resp: 16 18 18 18   Temp: 97.8 F (36.6 C) 98.4 F (36.9 C) (!) 97.2 F (36.2 C) 97.9 F (36.6 C)  TempSrc: Oral Oral Oral Oral  SpO2: 94% 96% 98% 97%  Weight:      Height:        General: Pt is alert, awake, not in acute distress Cardiovascular: RRR, S1/S2 +, no rubs, no gallops Respiratory: CTA bilaterally, no wheezing, no rhonchi Abdominal: Soft, NT, ND, bowel sounds + Extremities: no edema, no cyanosis   The results of significant diagnostics from this hospitalization (including imaging, microbiology, ancillary and laboratory) are listed below for reference.    Microbiology: Recent Results (from the past 240 hour(s))  Urine culture     Status: Abnormal (Preliminary result)   Collection Time: 07/05/20  5:02 AM   Specimen: Urine, Random  Result Value Ref Range Status   Specimen Description   Final    URINE, RANDOM Performed at [facility], [address] [place], [place] [address]    Special Requests   Final    NONE Performed at [facility], [address] [place], [place] [address]    Culture (A)  Final    >=100,000 COLONIES/mL ESCHERICHIA COLI SUSCEPTIBILITIES TO FOLLOW CULTURE REINCUBATED FOR BETTER GROWTH Performed at [facility], [address], [place], [place] [address]    Report Status PENDING  Incomplete  SARS Coronavirus 2 by RT PCR (hospital order, performed in [facility] hospital lab) Nasopharyngeal Nasopharyngeal Swab     Status: None   Collection Time: 07/05/20  8:44 AM   Specimen: Nasopharyngeal Swab  Result Value Ref Range Status   SARS Coronavirus 2 NEGATIVE NEGATIVE Final    Comment: (NOTE) SARS-CoV-2 target nucleic acids are NOT DETECTED.  The SARS-CoV-2 RNA is generally detectable in upper and lower respiratory specimens during the acute phase of infection. The lowest concentration of  SARS-CoV-2 viral copies this assay can detect is 250 copies / mL. A negative result does not preclude SARS-CoV-2 infection and should not be used as the sole basis for treatment or other patient management decisions.  A negative result may occur with improper specimen collection / handling, submission of specimen other than nasopharyngeal swab, presence of viral mutation(s) within the areas targeted by this assay, and inadequate number of viral copies (<250 copies / mL). A negative result must be combined with clinical observations, patient history, and epidemiological information.  Fact Sheet for Patients:   BoilerBrush.com.cy  Fact Sheet for Healthcare Providers: https://pope.com/  This test is not yet approved or  cleared by the [place] FDA and has been authorized for detection and/or diagnosis of SARS-CoV-2 by FDA under an Emergency Use Authorization (EUA).  This EUA will remain in effect (meaning this test can be used) for the duration of the COVID-19 declaration under Section 564(b)(1) of the Act, 21 U.S.C. section 360bbb-3(b)(1), unless the authorization is terminated or revoked sooner.  Performed at [facility], [address] [place], [place] [address]      Labs: BNP (last 3 results) No results for input(s): BNP in the last 8760 hours. Basic Metabolic Panel: Recent Labs  Lab 07/05/20 0501 07/06/20 0443  NA 139 140  K 3.5 3.3*  CL 104 105  CO2 26 26  GLUCOSE 147* 114*  BUN 24* 15  CREATININE 0.64 0.71  CALCIUM 8.8* 8.9  MG 2.2  --    Liver Function Tests: Recent Labs  Lab 07/05/20 0501  AST 19  ALT 22  ALKPHOS 83  BILITOT 0.8  PROT 7.7  ALBUMIN 4.3   No results for input(s): LIPASE, AMYLASE in the last 168 hours. No results for input(s): AMMONIA in the last 168 hours. CBC: Recent Labs  Lab 07/05/20 0501 07/06/20 0443  WBC 8.5 7.4  NEUTROABS 5.7  --   HGB 16.2* 14.3  HCT 45.9  43.3  MCV 87.8 91.0  PLT 239 217   Cardiac Enzymes: No results for input(s): CKTOTAL, CKMB, CKMBINDEX, TROPONINI in the last 168 hours. BNP: Invalid input(s): POCBNP CBG: No results for input(s): GLUCAP in the last 168 hours. D-Dimer No results for input(s): DDIMER in the last 72 hours. Hgb A1c No results for input(s): HGBA1C in the last 72 hours. Lipid Profile No results for input(s): CHOL, HDL, LDLCALC, TRIG, CHOLHDL, LDLDIRECT in the last 72 hours. Thyroid function studies No results for input(s): TSH, T4TOTAL, T3FREE, THYROIDAB in the last 72 hours.  Invalid input(s): FREET3 Anemia work up No results for input(s): VITAMINB12, FOLATE, FERRITIN, TIBC, IRON, RETICCTPCT in the last 72 hours. Urinalysis    Component Value Date/Time   COLORURINE YELLOW (A) 07/05/2020 0502   APPEARANCEUR CLOUDY (A) 07/05/2020  0502   LABSPEC 1.019 07/05/2020 0502   PHURINE 5.0 07/05/2020 0502   GLUCOSEU NEGATIVE 07/05/2020 0502   HGBUR SMALL (A) 07/05/2020 0502   BILIRUBINUR NEGATIVE 07/05/2020 0502   KETONESUR 5 (A) 07/05/2020 0502   PROTEINUR NEGATIVE 07/05/2020 0502   NITRITE NEGATIVE 07/05/2020 0502   LEUKOCYTESUR SMALL (A) 07/05/2020 0502   Sepsis Labs Invalid input(s): PROCALCITONIN,  WBC,  LACTICIDVEN Microbiology Recent Results (from the past 240 hour(s))  Urine culture     Status: Abnormal (Preliminary result)   Collection Time: 07/05/20  5:02 AM   Specimen: Urine, Random  Result Value Ref Range Status   Specimen Description   Final    URINE, RANDOM Performed at [facility], [address], [place], [place] [address]    Special Requests   Final    NONE Performed at [facility], [address], [place], [place] [address]    Culture (A)  Final    >=100,000 COLONIES/mL ESCHERICHIA COLI SUSCEPTIBILITIES TO FOLLOW CULTURE REINCUBATED FOR BETTER GROWTH Performed at [facility], [address], [place], [place] [address]    Report Status PENDING   Incomplete  SARS Coronavirus 2 by RT PCR (hospital order, performed in [facility] hospital lab) Nasopharyngeal Nasopharyngeal Swab     Status: None   Collection Time: 07/05/20  8:44 AM   Specimen: Nasopharyngeal Swab  Result Value Ref Range Status   SARS Coronavirus 2 NEGATIVE NEGATIVE Final    Comment: (NOTE) SARS-CoV-2 target nucleic acids are NOT DETECTED.  The SARS-CoV-2 RNA is generally detectable in upper and lower respiratory specimens during the acute phase of infection. The lowest concentration of SARS-CoV-2 viral copies this assay can detect is 250 copies / mL. A negative result does not preclude SARS-CoV-2 infection and should not be used as the sole basis for treatment or other patient management decisions.  A negative result may occur with improper specimen collection / handling, submission of specimen other than nasopharyngeal swab, presence of viral mutation(s) within the areas targeted by this assay, and inadequate number of viral copies (<250 copies / mL). A negative result must be combined with clinical observations, patient history, and epidemiological information.  Fact Sheet for Patients:   BoilerBrush.com.cy  Fact Sheet for Healthcare Providers: https://pope.com/  This test is not yet approved or  cleared by the [place] FDA and has been authorized for detection and/or diagnosis of SARS-CoV-2 by FDA under an Emergency Use Authorization (EUA).  This EUA will remain in effect (meaning this test can be used) for the duration of the COVID-19 declaration under Section 564(b)(1) of the Act, 21 U.S.C. section 360bbb-3(b)(1), unless the authorization is terminated or revoked sooner.  Performed at [facility], [address] [place], [place] [address]     Time coordinating discharge: Over 30 minutes  SIGNED:  Arnetha Courser, MD  [facility] 07/06/2020, 12:30 PM  If 7PM-7AM, please contact  night-coverage [email or web address]  This record has been created using Conservation officer, historic buildings. Errors have been sought and corrected,but may not always be located. Such creation errors do not reflect on the standard of care.

## 2020-07-06 NOTE — TOC Transition Note (Signed)
Transition of Care [facility]) - CM/SW Discharge Note   Patient Details  Name: Christy Mcclain MRN: [id number] Date of Birth: 05-Nov-1946  Transition of Care [facility]) CM/SW Contact:  Allayne Butcher, RN Phone Number: 07/06/2020, 3:20 PM   Clinical Narrative:    Patient is medically cleared for discharge home with home health services RN, PT, and OT through [facility].  Feliberto Gottron with [facility] accepted the referral and nursing or PT should be able to go out and see patient tomorrow.  Patient's daughter will be taking her home.  Patient was not going to be able to drive to outpatient therapy so home health was arranged instead.    Final next level of care: Home w Home Health Services Barriers to Discharge: Barriers Resolved   Patient Goals and CMS Choice Patient states their goals for this hospitalization and ongoing recovery are:: to go home CMS Medicare.gov Compare Post Acute Care list provided to:: Patient Choice offered to / list presented to : Patient  Discharge Placement                       Discharge Plan and Services                          HH Arranged: RN, PT, OT [facility] Agency: [facility] Date [facility] Agency Contacted: 07/06/20 Time HH Agency Contacted: 1520 Representative spoke with at [facility] Agency: Feliberto Gottron  Social Determinants of Health (SDOH) Interventions     Readmission Risk Interventions No flowsheet data found.

## 2020-07-06 NOTE — Evaluation (Signed)
Physical Therapy Evaluation Patient Details Name: Christy Mcclain MRN: [id number] DOB: August 03, 1946 Today's Date: 07/06/2020   History of Present Illness  Patient is a 74 year old female with hypertensive urgency and dizziness in the setting of UTI. MRI of brain shows negative motion degraded brain MRI.Patient with past medical history of osteoporosis, GERD    Clinical Impression   PT evaluation completed. Patient states she has mild dizziness that is not exacerbated with movement during session including transition from sitting to stand, progression to walking, head turns and seems constant with activity. This does not appear to impact functional independence with ambulation, transfers, etc. However, patient does have chronic arthritis and impaired gait pattern with walking. Patient states she actually has no knee pain or left hip pain today, however does reports usually this pain is daily and impacts functional independence at times. Patient ambulated around nursing station without assistive device and cues for safety, Min guard for safety. Blood pressure 162/42mmHg after walking, Sp02 99% on room air, pulse 80. Recommend outpatient PT at discharge for higher level balance and gait activity given chronic pain in joints at baseline.     Follow Up Recommendations Outpatient PT    Equipment Recommendations  None recommended by PT    Recommendations for Other Services       Precautions / Restrictions Precautions Precautions: Fall Restrictions Weight Bearing Restrictions: No      Mobility  Bed Mobility               General bed mobility comments: not observed as patient sitting up on arrival to room and post session   Transfers Overall transfer level: Modified independent Equipment used: None Transfers: Sit to/from Stand Sit to Stand: Modified independent (Device/Increase time)         General transfer comment: multiple standing bouts performed from bed, Mod I    Ambulation/Gait Ambulation/Gait assistance: Min guard Gait Distance (Feet): 160 Feet   Gait Pattern/deviations: Decreased stance time - left;Decreased step length - left;Antalgic Gait velocity: decreased    General Gait Details: decreased step length and stance time initially progressing to increased and equal step length with increased ambulation distance. patient reports no knee or hip pain, however does demonstrate antalgic gait pattern. cues for safety   Stairs            Wheelchair Mobility    Modified Rankin (Stroke Patients Only)       Balance Overall balance assessment: Needs assistance Sitting-balance support: Feet supported Sitting balance-Leahy Scale: Normal     Standing balance support: No upper extremity supported Standing balance-Leahy Scale: Good Standing balance comment: static and dynamic balance without UE support                              Pertinent Vitals/Pain Pain Assessment: No/denies pain    Home Living Family/patient expects to be discharged to:: Private residence Living Arrangements: Alone Available Help at Discharge: Family;Friend(s);Available PRN/intermittently Type of Home: Apartment Home Access: Level entry     Home Layout: One level Home Equipment: Grab bars - tub/shower;Grab bars - toilet      Prior Function Level of Independence: Independent         Comments: patient reports no assistive device with ambulation, however intermittently needs furniture for support depending on arthritic pain from left hip and bilteral knees. independent with ALDs at baseline      Hand Dominance   Dominant Hand: Right  Extremity/Trunk Assessment   Upper Extremity Assessment Upper Extremity Assessment: Overall WFL for tasks assessed    Lower Extremity Assessment Lower Extremity Assessment: LLE deficits/detail;RLE deficits/detail RLE Deficits / Details: 5/5 strength dorsiflexion, plantarflexion, knee extension, hip  add/abd. no knee pain reported today  RLE Sensation: WNL RLE Coordination: WNL LLE Deficits / Details: 5/5 strength dorsiflexion, plantarflexion, knee extension, hip add/abd. no knee pain reported today  LLE Sensation: WNL LLE Coordination: WNL       Communication   Communication: No difficulties  Cognition Arousal/Alertness: Awake/alert Behavior During Therapy: WFL for tasks assessed/performed Overall Cognitive Status: Within Functional Limits for tasks assessed                                 General Comments: patient able to follow all commands without difficulty       General Comments      Exercises     Assessment/Plan    PT Assessment Patient needs continued PT services  PT Problem List Decreased mobility;Decreased activity tolerance;Decreased knowledge of precautions       PT Treatment Interventions      PT Goals (Current goals can be found in the Care Plan section)  Acute Rehab PT Goals Patient Stated Goal: to go home when blood pressure is under control  PT Goal Formulation: With patient Time For Goal Achievement: 07/20/20 Potential to Achieve Goals: Good    Frequency Min 2X/week   Barriers to discharge        Co-evaluation               AM-PAC PT "6 Clicks" Mobility  Outcome Measure Help needed turning from your back to your side while in a flat bed without using bedrails?: None Help needed moving from lying on your back to sitting on the side of a flat bed without using bedrails?: None Help needed moving to and from a bed to a chair (including a wheelchair)?: None Help needed standing up from a chair using your arms (e.g., wheelchair or bedside chair)?: None Help needed to walk in hospital room?: None Help needed climbing 3-5 steps with a railing? : A Little 6 Click Score: 23    End of Session   Activity Tolerance: Patient tolerated treatment well Patient left:  (sitting up on edge of bed with nurse present in room ) Nurse  Communication: Mobility status PT Visit Diagnosis: Other abnormalities of gait and mobility (R26.89);Dizziness and giddiness (R42)    Time: 2423-5361 PT Time Calculation (min) (ACUTE ONLY): 25 min   Charges:   PT Evaluation $PT Eval Moderate Complexity: 1 Mod PT Treatments $Gait Training: 8-22 mins        Donna Bernard, PT, MPT  Ina [employer] 07/06/2020, 10:31 AM

## 2020-07-06 NOTE — Care Management Obs Status (Signed)
MEDICARE OBSERVATION STATUS NOTIFICATION   Patient Details  Name: Christy Mcclain MRN: [id number] Date of Birth: Aug 06, 1946   Medicare Observation Status Notification Given:  Yes    Allayne Butcher, RN 07/06/2020, 11:12 AM

## 2020-07-06 NOTE — Care Management CC44 (Deleted)
Condition Code 44 Documentation Completed  Patient Details  Name: Christy Mcclain MRN: [id number] Date of Birth: Oct 06, 1946   Condition Code 44 given:    Patient signature on Condition Code 44 notice:    Documentation of 2 MD's agreement:    Code 44 added to claim:       Allayne Butcher, RN 07/06/2020, 10:17 AM

## 2020-07-06 NOTE — TOC Initial Note (Signed)
Transition of Care [facility]) - Initial/Assessment Note    Patient Details  Name: Christy Mcclain MRN: [id number] Date of Birth: 1946-11-01  Transition of Care [facility]) CM/SW Contact:    Allayne Butcher, RN Phone Number: 07/06/2020, 10:12 AM  Clinical Narrative:                 Patient placed under observation for hypertensive urgency.  Patient reports she does not have a history of high blood pressure and she does not take any medications.  Patient lives in [place] by herself.  Patient is independent and drives.  Patient goes to [facility] in [place] for PCP services.    Expected Discharge Plan: Home/Self Care Barriers to Discharge: Continued Medical Work up   Patient Goals and CMS Choice Patient states their goals for this hospitalization and ongoing recovery are:: to go home      Expected Discharge Plan and Services Expected Discharge Plan: Home/Self Care       Living arrangements for the past 2 months: Apartment                                      Prior Living Arrangements/Services Living arrangements for the past 2 months: Apartment Lives with:: Self Patient language and need for interpreter reviewed:: Yes Do you feel safe going back to the place where you live?: Yes      Need for Family Participation in Patient Care: No (Comment) Care giver support system in place?: Yes (comment) (2 children)   Criminal Activity/Legal Involvement Pertinent to Current Situation/Hospitalization: No - Comment as needed  Activities of Daily Living      Permission Sought/Granted                  Emotional Assessment Appearance:: Appears younger than stated age Attitude/Demeanor/Rapport: Engaged Affect (typically observed): Accepting Orientation: : Oriented to Self, Oriented to Place, Oriented to  Time, Oriented to Situation Alcohol / Substance Use: Not Applicable Psych Involvement: No (comment)  Admission diagnosis:  Dizziness [R42] Vertigo [R42] Hypertensive  urgency [I16.0] Essential hypertension [I10] Urinary tract infection without hematuria, site unspecified [N39.0] Patient Active Problem List   Diagnosis Date Noted  . Hypertensive urgency 07/05/2020  . UTI (urinary tract infection) 07/05/2020  . Dizziness 07/05/2020  . GERD (gastroesophageal reflux disease)    PCP:  Olena Leatherwood, FNP Pharmacy:   [facility] [address] [place] - [address] [place] [address] Phone: [phone] Fax: [phone]  [facility] [place], [place] - [address] [place] [address] Phone: [phone] Fax: [phone]     Social Determinants of Health (SDOH) Interventions    Readmission Risk Interventions No flowsheet data found.

## 2020-07-06 NOTE — Progress Notes (Signed)
Pt refuse bed alarm. Educated dizziness increases fall risk. Pt states she will use call bell when needing to get up.

## 2020-07-07 LAB — URINE CULTURE: Culture: >=100000 — AB

## 2021-12-23 IMAGING — CT CT HEAD W/O CM
3 series · 16 of 45 positions shown, 19 images · non-contrast
Comparison: None.

CLINICAL DATA: Nonspecific dizziness.

EXAM:
CT HEAD WITHOUT CONTRAST
TECHNIQUE: Contiguous axial images were obtained from the base of the skull
through the vertex without intravenous contrast.

[Series 3: head wo · axial · 0.39mm/px · z∈[-107,+8]mm · 10 of 28 slices shown, 13 images]
[im 3/28  brain]
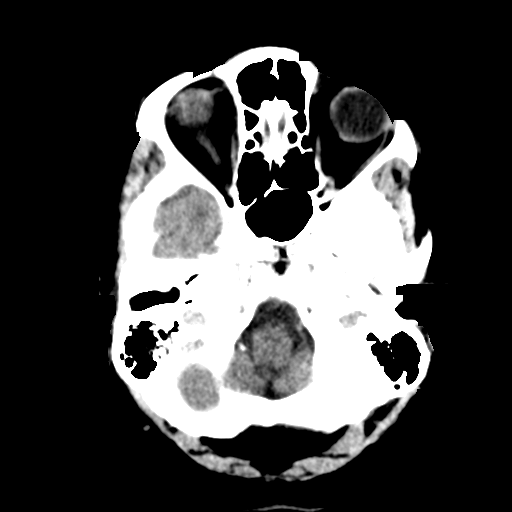
[im 3/28  bone]
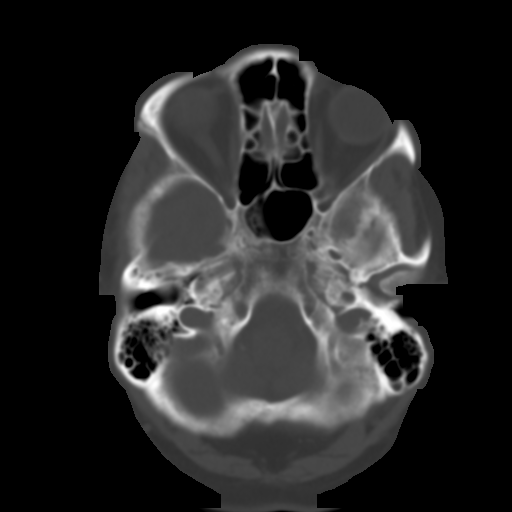
[im 5/28  brain]
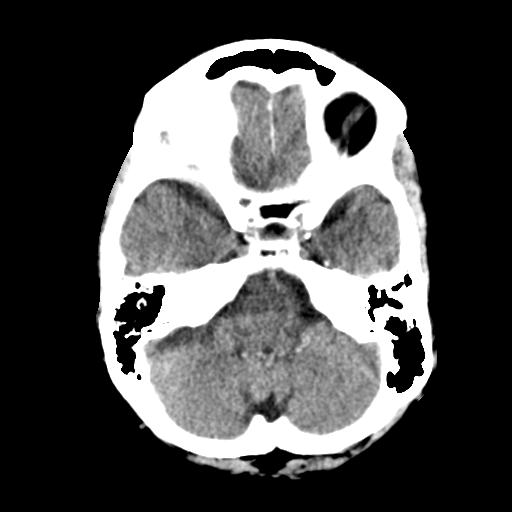
[im 8/28  brain]
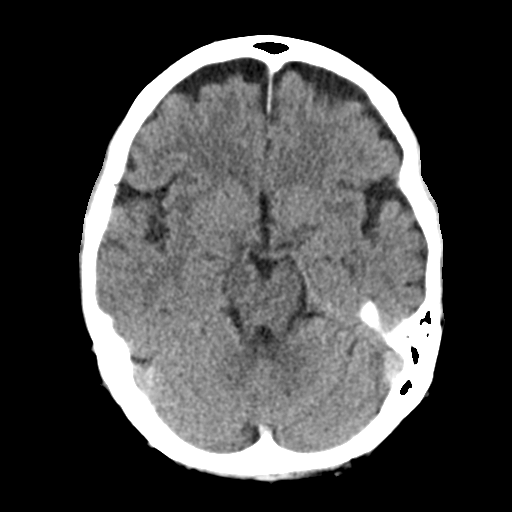
[im 11/28  brain]
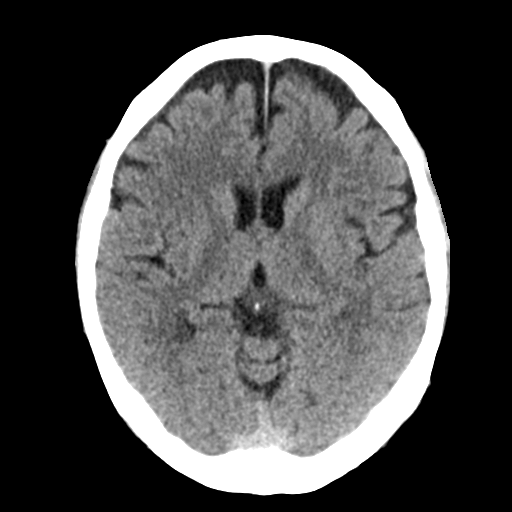
[im 13/28  brain]
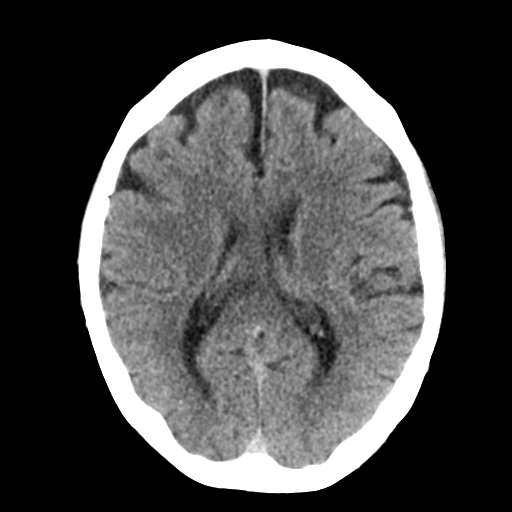
[im 13/28  bone]
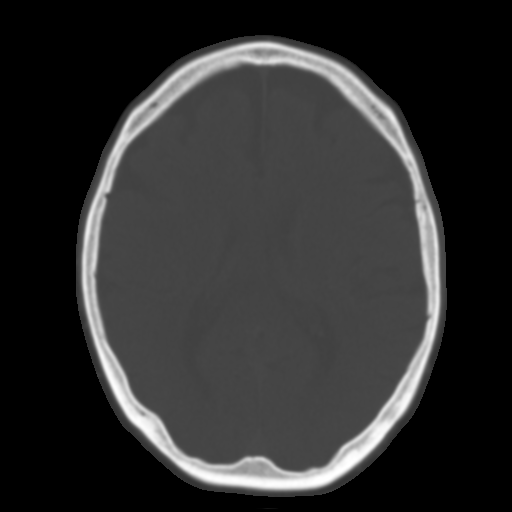
[im 16/28  brain]
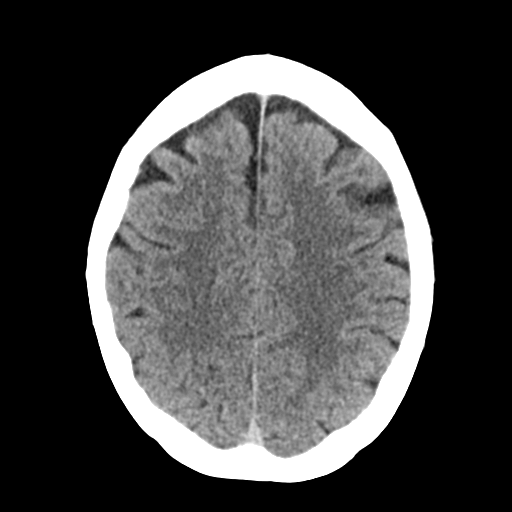
[im 18/28  brain]
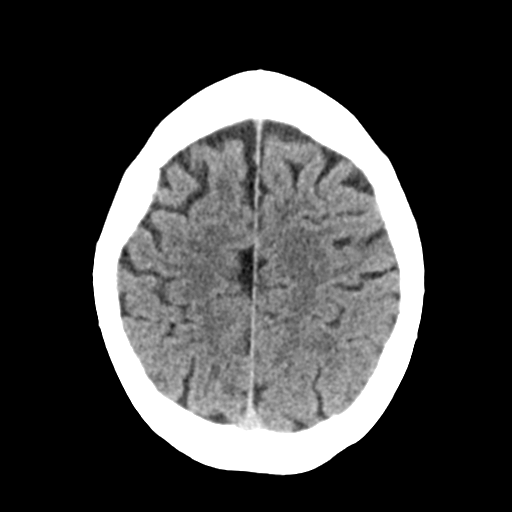
[im 21/28  brain]
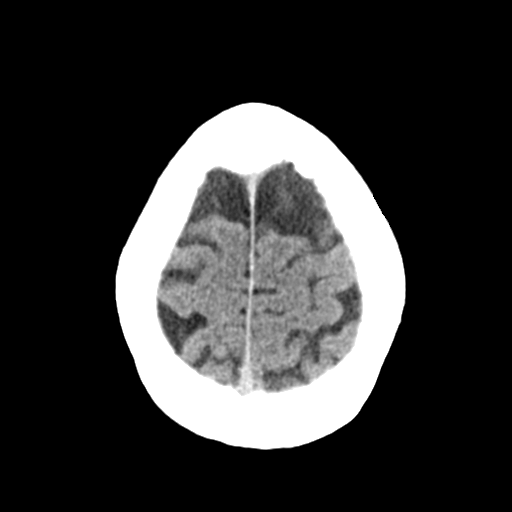
[im 24/28  brain]
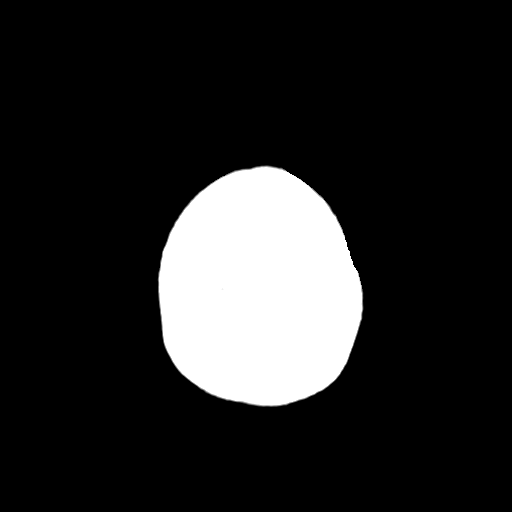
[im 24/28  bone]
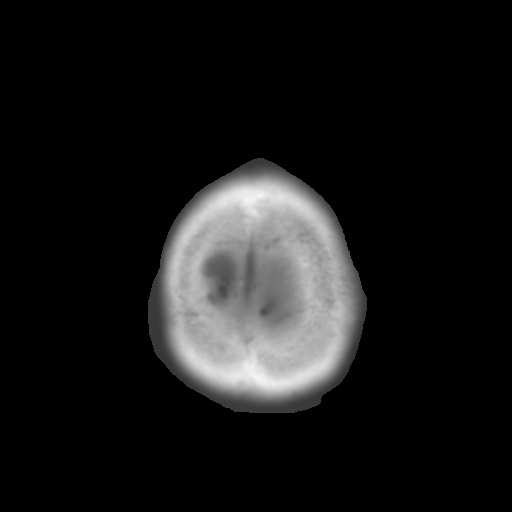
[im 26/28  brain]
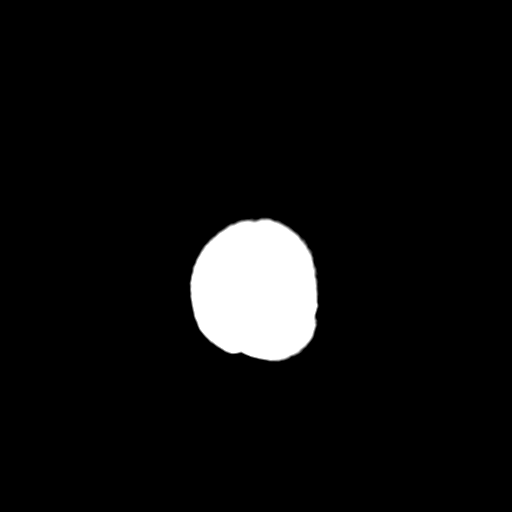

[Series 4: coronal soft tissue · coronal · 0.28mm/px · 3 of 63 slices shown]
[im 21/63  brain]
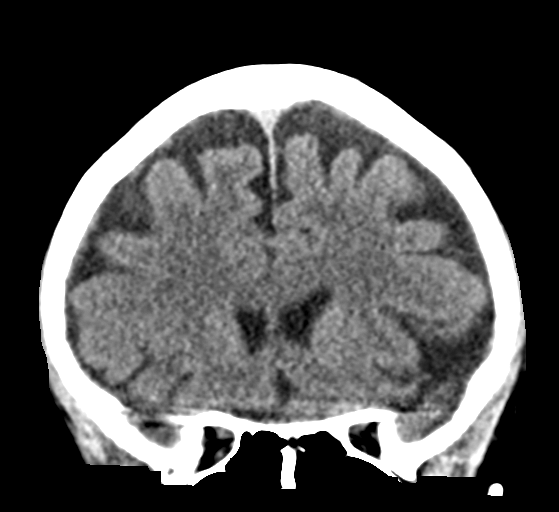
[im 28/63  brain]
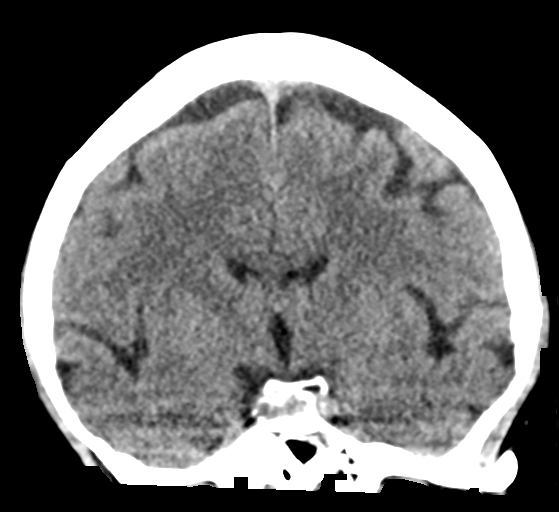
[im 35/63  brain]
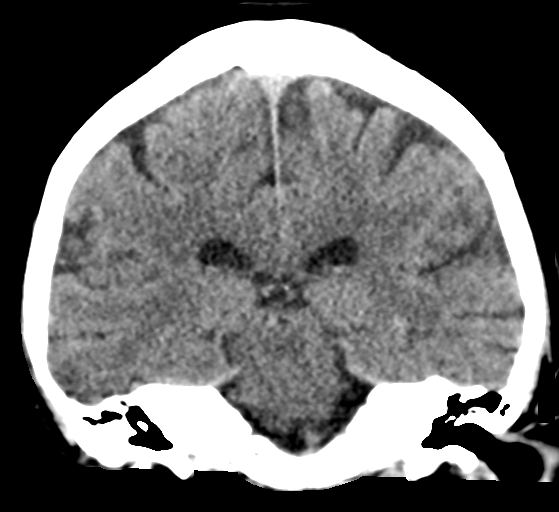

[Series 5: sagittal soft tissue · sagittal · 0.28mm/px · 3 of 52 slices shown]
[im 18/52  brain]
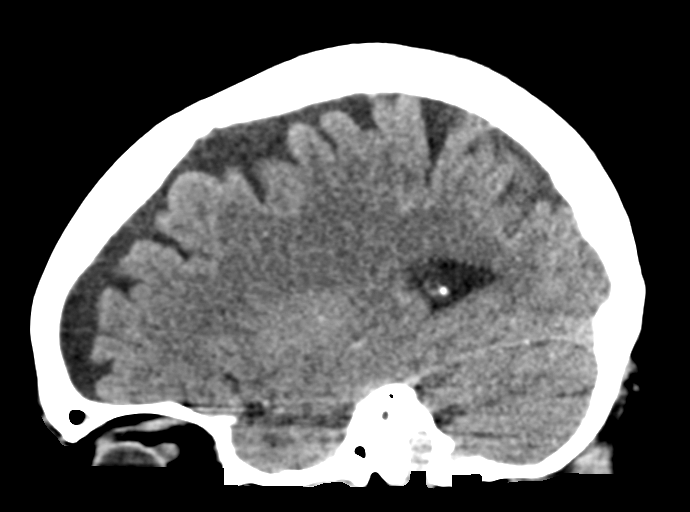
[im 26/52  brain]
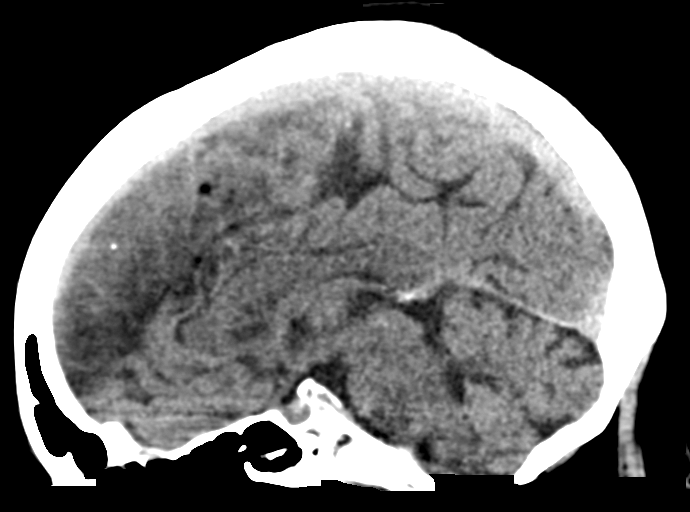
[im 35/52  brain]
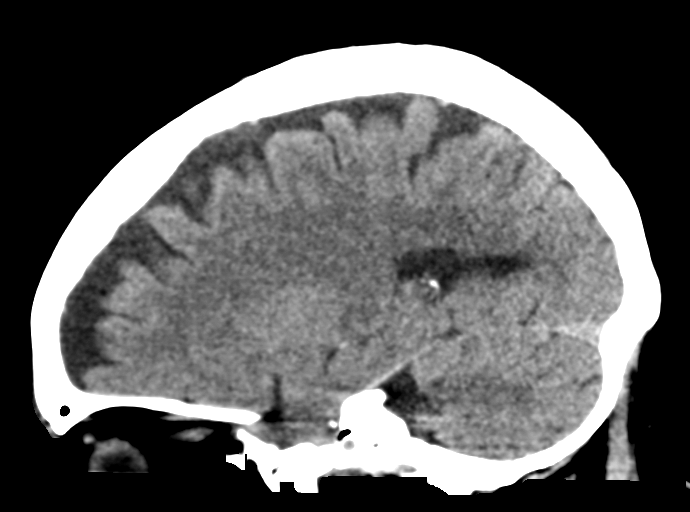

[16 of 45 positions shown; findings below may reference images not displayed]

FINDINGS: Brain: No evidence of acute infarction, hemorrhage, hydrocephalus,
extra-axial collection or mass lesion/mass effect.

Vascular: No hyperdense vessel or unexpected calcification.

Skull: Normal. Negative for fracture or focal lesion.

Sinuses/Orbits: Negative
IMPRESSION: Negative head CT.

## 2022-06-23 ENCOUNTER — Encounter: Payer: Self-pay | Admitting: Emergency Medicine

## 2022-06-23 ENCOUNTER — Other Ambulatory Visit: Payer: Self-pay

## 2022-06-23 ENCOUNTER — Emergency Department: Payer: Medicare Other

## 2022-06-23 ENCOUNTER — Emergency Department
Admission: EM | Admit: 2022-06-23 | Discharge: 2022-06-23 | Disposition: A | Discharge status: Home / Self Care | Payer: Medicare Other | Attending: Emergency Medicine | Admitting: Emergency Medicine

## 2022-06-23 DIAGNOSIS — R000 Tachycardia, unspecified: Secondary | ICD-10-CM | POA: Insufficient documentation

## 2022-06-23 DIAGNOSIS — R1011 Right upper quadrant pain: Secondary | ICD-10-CM | POA: Diagnosis not present

## 2022-06-23 DIAGNOSIS — I160 Hypertensive urgency: Secondary | ICD-10-CM | POA: Diagnosis not present

## 2022-06-23 DIAGNOSIS — Y92410 Unspecified street and highway as the place of occurrence of the external cause: Secondary | ICD-10-CM | POA: Insufficient documentation

## 2022-06-23 DIAGNOSIS — S299XXA Unspecified injury of thorax, initial encounter: Secondary | ICD-10-CM | POA: Diagnosis not present

## 2022-06-23 DIAGNOSIS — N3000 Acute cystitis without hematuria: Secondary | ICD-10-CM

## 2022-06-23 DIAGNOSIS — R8289 Other abnormal findings on cytological and histological examination of urine: Secondary | ICD-10-CM | POA: Diagnosis not present

## 2022-06-23 DIAGNOSIS — S298XXA Other specified injuries of thorax, initial encounter: Secondary | ICD-10-CM

## 2022-06-23 LAB — URINALYSIS, ROUTINE W REFLEX MICROSCOPIC
Bilirubin Urine: NEGATIVE
Glucose, UA: NEGATIVE mg/dL
Ketones, ur: NEGATIVE mg/dL
Nitrite: POSITIVE — AB
Protein, ur: 30 mg/dL — AB
Specific Gravity, Urine: 1.011 (ref 1.005–1.030)
pH: 5 (ref 5.0–8.0)

## 2022-06-23 LAB — COMPREHENSIVE METABOLIC PANEL
ALT: 32 U/L (ref 0–44)
AST: 25 U/L (ref 15–41)
Albumin: 4 g/dL (ref 3.5–5.0)
Alkaline Phosphatase: 82 U/L (ref 38–126)
Anion gap: 7 (ref 5–15)
BUN: 15 mg/dL (ref 8–23)
CO2: 25 mmol/L (ref 22–32)
Calcium: 9.5 mg/dL (ref 8.9–10.3)
Chloride: 105 mmol/L (ref 98–111)
Creatinine, Ser: 0.72 mg/dL (ref 0.44–1.00)
GFR, Estimated: >60 mL/min (ref >60)
Glucose, Bld: 144 mg/dL — ABNORMAL HIGH (ref 70–99)
Potassium: 3.8 mmol/L (ref 3.5–5.1)
Sodium: 137 mmol/L (ref 135–145)
Total Bilirubin: 0.6 mg/dL (ref 0.3–1.2)
Total Protein: 7.9 g/dL (ref 6.5–8.1)

## 2022-06-23 LAB — CBC WITH DIFFERENTIAL/PLATELET
Abs Immature Granulocytes: 0.05 10*3/uL (ref 0.00–0.07)
Basophils Absolute: 0.1 10*3/uL (ref 0.0–0.1)
Basophils Relative: 1 %
Eosinophils Absolute: 0.1 10*3/uL (ref 0.0–0.5)
Eosinophils Relative: 1 %
HCT: 48.8 % — ABNORMAL HIGH (ref 36.0–46.0)
Hemoglobin: 16.1 g/dL — ABNORMAL HIGH (ref 12.0–15.0)
Immature Granulocytes: 1 %
Lymphocytes Relative: 18 %
Lymphs Abs: 1.8 10*3/uL (ref 0.7–4.0)
MCH: 29.9 pg (ref 26.0–34.0)
MCHC: 33 g/dL (ref 30.0–36.0)
MCV: 90.5 fL (ref 80.0–100.0)
Monocytes Absolute: 0.6 10*3/uL (ref 0.1–1.0)
Monocytes Relative: 6 %
Neutro Abs: 7.4 10*3/uL (ref 1.7–7.7)
Neutrophils Relative %: 73 %
Platelets: 275 10*3/uL (ref 150–400)
RBC: 5.39 MIL/uL — ABNORMAL HIGH (ref 3.87–5.11)
RDW: 13 % (ref 11.5–15.5)
WBC: 10 10*3/uL (ref 4.0–10.5)
nRBC: 0 % (ref 0.0–0.2)

## 2022-06-23 LAB — TROPONIN I (HIGH SENSITIVITY)
Troponin I (High Sensitivity): 5 ng/L (ref <18)
Troponin I (High Sensitivity): 7 ng/L (ref <18)

## 2022-06-23 MED ORDER — SULFAMETHOXAZOLE-TRIMETHOPRIM 800-160 MG PO TABS: 1.0000 | ORAL_TABLET | Freq: Two times a day (BID) | ORAL | 0 refills | Status: AC

## 2022-06-23 MED ORDER — MORPHINE SULFATE (PF) 4 MG/ML IV SOLN
4.0000 mg | Freq: Once | INTRAVENOUS | Status: AC
  Administered 2022-06-23: 4 mg via INTRAVENOUS
  Filled 2022-06-23: qty 1

## 2022-06-23 MED ORDER — ONDANSETRON HCL 4 MG/2ML IJ SOLN
4.0000 mg | Freq: Once | INTRAMUSCULAR | Status: AC
  Administered 2022-06-23: 4 mg via INTRAVENOUS
  Filled 2022-06-23: qty 2

## 2022-06-23 MED ORDER — OXYCODONE HCL 5 MG PO TABS: 5.0000 mg | ORAL_TABLET | Freq: Three times a day (TID) | ORAL | 0 refills | Status: AC | PRN

## 2022-06-23 MED ORDER — FENTANYL CITRATE PF 50 MCG/ML IJ SOSY
50.0000 ug | PREFILLED_SYRINGE | Freq: Once | INTRAMUSCULAR | Status: AC
  Administered 2022-06-23: 50 ug via INTRAVENOUS
  Filled 2022-06-23: qty 1
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
## 2022-06-23 NOTE — ED Notes (Signed)
Pain med given for pain rating of a 6.

## 2022-06-26 LAB — URINE CULTURE: Culture: >=100000 — AB
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
